# Patient Record
Sex: Female | Born: 1975 | Race: White | Hispanic: No | Marital: Married | Smoking: Former smoker
Health system: Southern US, Community
[De-identification: ages and names within clinical notes are randomized; demographics above are authoritative.]

## PROBLEM LIST (undated history)

## (undated) DIAGNOSIS — R7303 Prediabetes: Secondary | ICD-10-CM

## (undated) DIAGNOSIS — M419 Scoliosis, unspecified: Secondary | ICD-10-CM

## (undated) DIAGNOSIS — N939 Abnormal uterine and vaginal bleeding, unspecified: Secondary | ICD-10-CM

## (undated) DIAGNOSIS — I10 Essential (primary) hypertension: Secondary | ICD-10-CM

## (undated) DIAGNOSIS — Z9889 Other specified postprocedural states: Secondary | ICD-10-CM

## (undated) DIAGNOSIS — O3440 Maternal care for other abnormalities of cervix, unspecified trimester: Secondary | ICD-10-CM

## (undated) DIAGNOSIS — K219 Gastro-esophageal reflux disease without esophagitis: Secondary | ICD-10-CM

## (undated) DIAGNOSIS — K589 Irritable bowel syndrome without diarrhea: Secondary | ICD-10-CM

## (undated) HISTORY — DX: Maternal care for other abnormalities of cervix, unspecified trimester: Z98.890

## (undated) HISTORY — PX: CHOLECYSTECTOMY: SHX55

## (undated) HISTORY — DX: Maternal care for other abnormalities of cervix, unspecified trimester: O34.40

## (undated) HISTORY — PX: TUBAL LIGATION: SHX77

## (undated) HISTORY — PX: LEEP: SHX91

---

## 1997-11-30 ENCOUNTER — Other Ambulatory Visit: Admission: RE | Admit: 1997-11-30 | Discharge: 1997-11-30 | Payer: Self-pay | Admitting: Obstetrics and Gynecology

## 1997-12-16 ENCOUNTER — Inpatient Hospital Stay (HOSPITAL_COMMUNITY): Admission: AD | Admit: 1997-12-16 | Discharge: 1997-12-16 | Payer: Self-pay | Admitting: Obstetrics and Gynecology

## 1998-02-17 ENCOUNTER — Inpatient Hospital Stay (HOSPITAL_COMMUNITY): Admission: AD | Admit: 1998-02-17 | Discharge: 1998-02-17 | Payer: Self-pay | Admitting: Obstetrics and Gynecology

## 1998-03-22 ENCOUNTER — Ambulatory Visit (HOSPITAL_COMMUNITY): Admission: RE | Admit: 1998-03-22 | Discharge: 1998-03-22 | Payer: Self-pay | Admitting: Obstetrics & Gynecology

## 1998-04-03 ENCOUNTER — Ambulatory Visit (HOSPITAL_COMMUNITY): Admission: RE | Admit: 1998-04-03 | Discharge: 1998-04-03 | Payer: Self-pay | Admitting: Obstetrics & Gynecology

## 1998-06-18 ENCOUNTER — Inpatient Hospital Stay (HOSPITAL_COMMUNITY): Admission: AD | Admit: 1998-06-18 | Discharge: 1998-06-20 | Payer: Self-pay | Admitting: Obstetrics and Gynecology

## 1998-07-24 ENCOUNTER — Other Ambulatory Visit: Admission: RE | Admit: 1998-07-24 | Discharge: 1998-07-24 | Payer: Self-pay | Admitting: Obstetrics and Gynecology

## 1999-12-05 ENCOUNTER — Inpatient Hospital Stay (HOSPITAL_COMMUNITY): Admission: AD | Admit: 1999-12-05 | Discharge: 1999-12-05 | Payer: Self-pay | Admitting: Obstetrics & Gynecology

## 2002-06-28 ENCOUNTER — Emergency Department (HOSPITAL_COMMUNITY): Admission: EM | Admit: 2002-06-28 | Discharge: 2002-06-28 | Payer: Self-pay

## 2002-07-01 ENCOUNTER — Encounter: Admission: RE | Admit: 2002-07-01 | Discharge: 2002-07-01 | Payer: Self-pay | Admitting: Internal Medicine

## 2002-07-05 ENCOUNTER — Encounter: Admission: RE | Admit: 2002-07-05 | Discharge: 2002-07-05 | Payer: Self-pay | Admitting: Internal Medicine

## 2002-07-12 ENCOUNTER — Encounter: Admission: RE | Admit: 2002-07-12 | Discharge: 2002-07-12 | Payer: Self-pay | Admitting: Internal Medicine

## 2002-07-19 ENCOUNTER — Encounter: Admission: RE | Admit: 2002-07-19 | Discharge: 2002-07-19 | Payer: Self-pay | Admitting: Internal Medicine

## 2002-08-03 ENCOUNTER — Encounter: Admission: RE | Admit: 2002-08-03 | Discharge: 2002-08-03 | Payer: Self-pay | Admitting: Internal Medicine

## 2002-08-06 ENCOUNTER — Encounter: Admission: RE | Admit: 2002-08-06 | Discharge: 2002-08-06 | Payer: Self-pay | Admitting: Internal Medicine

## 2002-08-24 ENCOUNTER — Encounter: Admission: RE | Admit: 2002-08-24 | Discharge: 2002-08-24 | Payer: Self-pay | Admitting: Internal Medicine

## 2002-12-26 ENCOUNTER — Emergency Department (HOSPITAL_COMMUNITY): Admission: EM | Admit: 2002-12-26 | Discharge: 2002-12-26 | Payer: Self-pay | Admitting: Emergency Medicine

## 2004-06-01 ENCOUNTER — Inpatient Hospital Stay (HOSPITAL_COMMUNITY): Admission: AD | Admit: 2004-06-01 | Discharge: 2004-06-01 | Payer: Self-pay | Admitting: Family Medicine

## 2004-06-04 ENCOUNTER — Inpatient Hospital Stay (HOSPITAL_COMMUNITY): Admission: AD | Admit: 2004-06-04 | Discharge: 2004-06-04 | Payer: Self-pay | Admitting: Obstetrics and Gynecology

## 2005-01-10 ENCOUNTER — Ambulatory Visit (HOSPITAL_COMMUNITY): Admission: RE | Admit: 2005-01-10 | Discharge: 2005-01-10 | Payer: Self-pay | Admitting: Obstetrics & Gynecology

## 2005-04-09 ENCOUNTER — Inpatient Hospital Stay (HOSPITAL_COMMUNITY): Admission: AD | Admit: 2005-04-09 | Discharge: 2005-04-09 | Payer: Self-pay | Admitting: Obstetrics and Gynecology

## 2005-04-14 ENCOUNTER — Inpatient Hospital Stay (HOSPITAL_COMMUNITY): Admission: AD | Admit: 2005-04-14 | Discharge: 2005-04-16 | Payer: Self-pay | Admitting: Obstetrics and Gynecology

## 2005-10-20 ENCOUNTER — Emergency Department (HOSPITAL_COMMUNITY): Admission: EM | Admit: 2005-10-20 | Discharge: 2005-10-20 | Payer: Self-pay | Admitting: Emergency Medicine

## 2005-12-05 ENCOUNTER — Ambulatory Visit: Payer: Self-pay | Admitting: Family Medicine

## 2005-12-19 ENCOUNTER — Encounter (INDEPENDENT_AMBULATORY_CARE_PROVIDER_SITE_OTHER): Payer: Self-pay | Admitting: Specialist

## 2005-12-19 ENCOUNTER — Ambulatory Visit: Payer: Self-pay | Admitting: *Deleted

## 2005-12-19 ENCOUNTER — Other Ambulatory Visit: Admission: RE | Admit: 2005-12-19 | Discharge: 2005-12-19 | Payer: Self-pay | Admitting: Family Medicine

## 2006-01-09 ENCOUNTER — Ambulatory Visit: Payer: Self-pay | Admitting: Obstetrics & Gynecology

## 2006-09-02 ENCOUNTER — Inpatient Hospital Stay (HOSPITAL_COMMUNITY): Admission: AD | Admit: 2006-09-02 | Discharge: 2006-09-02 | Payer: Self-pay | Admitting: Obstetrics and Gynecology

## 2006-10-06 ENCOUNTER — Encounter: Admission: RE | Admit: 2006-10-06 | Discharge: 2006-10-06 | Payer: Self-pay | Admitting: Obstetrics & Gynecology

## 2006-11-15 ENCOUNTER — Inpatient Hospital Stay (HOSPITAL_COMMUNITY): Admission: AD | Admit: 2006-11-15 | Discharge: 2006-11-15 | Payer: Self-pay | Admitting: Obstetrics and Gynecology

## 2006-11-17 ENCOUNTER — Inpatient Hospital Stay (HOSPITAL_COMMUNITY): Admission: AD | Admit: 2006-11-17 | Discharge: 2006-11-20 | Payer: Self-pay | Admitting: Obstetrics & Gynecology

## 2008-09-10 ENCOUNTER — Emergency Department (HOSPITAL_BASED_OUTPATIENT_CLINIC_OR_DEPARTMENT_OTHER): Admission: EM | Admit: 2008-09-10 | Discharge: 2008-09-10 | Payer: Self-pay | Admitting: Emergency Medicine

## 2008-11-30 ENCOUNTER — Ambulatory Visit: Payer: Self-pay | Admitting: Obstetrics & Gynecology

## 2008-11-30 ENCOUNTER — Encounter: Payer: Self-pay | Admitting: Obstetrics & Gynecology

## 2010-02-24 ENCOUNTER — Ambulatory Visit: Payer: Self-pay | Admitting: Diagnostic Radiology

## 2010-02-24 ENCOUNTER — Emergency Department (HOSPITAL_BASED_OUTPATIENT_CLINIC_OR_DEPARTMENT_OTHER): Admission: EM | Admit: 2010-02-24 | Discharge: 2010-02-24 | Payer: Self-pay | Admitting: Emergency Medicine

## 2010-08-11 ENCOUNTER — Encounter: Payer: Self-pay | Admitting: Gastroenterology

## 2010-10-05 LAB — BASIC METABOLIC PANEL
BUN: 10 mg/dL (ref 6–23)
CO2: 24 mEq/L (ref 19–32)
Calcium: 8.9 mg/dL (ref 8.4–10.5)
Chloride: 104 mEq/L (ref 96–112)
Creatinine, Ser: 0.8 mg/dL (ref 0.4–1.2)
GFR calc Af Amer: >60 mL/min (ref >60)
GFR calc non Af Amer: >60 mL/min (ref >60)
Glucose, Bld: 96 mg/dL (ref 70–99)
Potassium: 4.1 mEq/L (ref 3.5–5.1)
Sodium: 140 mEq/L (ref 135–145)

## 2010-10-05 LAB — CBC
HCT: 40.7 % (ref 36.0–46.0)
Hemoglobin: 14.2 g/dL (ref 12.0–15.0)
MCH: 30.8 pg (ref 26.0–34.0)
MCHC: 35 g/dL (ref 30.0–36.0)
MCV: 88 fL (ref 78.0–100.0)
Platelets: 151 K/uL (ref 150–400)
RBC: 4.63 MIL/uL (ref 3.87–5.11)
RDW: 12.8 % (ref 11.5–15.5)
WBC: 7.2 K/uL (ref 4.0–10.5)

## 2010-10-05 LAB — POCT CARDIAC MARKERS
CKMB, poc: <1 ng/mL — ABNORMAL LOW (ref 1.0–8.0)
Myoglobin, poc: 46.5 ng/mL (ref 12–200)
Troponin i, poc: <0.05 ng/mL (ref 0.00–0.09)

## 2010-10-05 LAB — DIFFERENTIAL
Lymphocytes Relative: 19 % (ref 12–46)
Lymphs Abs: 1.4 10*3/uL (ref 0.7–4.0)
Neutro Abs: 5.2 10*3/uL (ref 1.7–7.7)
Neutrophils Relative %: 73 % (ref 43–77)

## 2010-12-04 NOTE — Group Therapy Note (Signed)
NAME:  Melissa Aguirre, Melissa Aguirre NO.:  [id number]   MEDICAL RECORD NO.:  [id number]          PATIENT TYPE:  WOC   LOCATION:  [facility]                   FACILITY:  [facility]   PHYSICIAN:  Scheryl Darter, MD       DATE OF BIRTH:  05/07/1976   DATE OF SERVICE:                                  CLINIC NOTE   CHIEF COMPLAINT:  Annual exam.   The patient is a 35 year old white female, gravida 4, para 3, abortus 1,  last menstrual period Nov 23, 2008.  She has had tubal ligation.  Her  cycles are about every 35-40 days, and periods are not heavy.  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  She has not had a  Pap smear since then.  No complaints of abnormal bleeding.  No discharge  or pain.   PAST MEDICAL HISTORY:  None.   PAST SURGICAL HISTORY:  1. Postpartum bilateral tubal ligation.  2. LEEP.  3. Cholecystectomy.   ALLERGIES:  SULFA.   MEDICATIONS:  None.   SOCIAL HISTORY:  The patient is a nonsmoker.  She denies alcohol or drug  use.  She denies any physical abuse.   REVIEW OF SYSTEMS:  Essentially negative today.   PHYSICAL EXAMINATION:  GENERAL:  The patient is in no acute distress.  VITAL SIGNS:  Weight is 202 pounds.  BP 135/96, pulse 75, height 5 feet  9 inches, temperature 97.9.  CHEST:  Clear.  HEART:  Regular rate and rhythm.  BREASTS:  Large without masses or tenderness.  ABDOMEN:  Soft, nontender, no mass.  EXTREMITIES:  Show no swelling.  She has a bruise on her left inner calf  from dropping a large object on her leg.  Says this is improving.  PELVIC EXAM:  External genitalia, vagina and cervix were normal with  well-healed cervix from her LEEP.  Pap was done.  Uterus normal size,  nontender, no mass.   IMPRESSION:  1. Normal gynecologic exam.  2. 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   PLAN:  The patient will be notified of her Pap result.  If normal, she  will return in 6 months for repeat Pap.      Scheryl Darter, MD     JA/MEDQ  D:  11/30/2008  T:  11/30/2008  Job:  [id number]

## 2010-12-07 NOTE — Op Note (Signed)
NAMEHAILY, CALEY              ACCOUNT NO.:  [id number]   MEDICAL RECORD NO.:  [id number]          PATIENT TYPE:  INP   LOCATION:  9121                          [facility]   PHYSICIAN:  Kendra H. Tenny Craw, MD     DATE OF BIRTH:  Oct 11, 1975   DATE OF PROCEDURE:  11/18/2006  DATE OF DISCHARGE:                               OPERATIVE REPORT   PREOPERATIVE DIAGNOSIS:  Desired permanent sterilization.   POSTOPERATIVE DIAGNOSIS:  Desired permanent sterilization.   PROCEDURE:  Postpartum tubal ligation with Filshie clips.   SURGEON:  Freddrick March. Tenny Craw, M.D.   ASSISTANT:  None.   ANESTHESIA:  Epidural.   ESTIMATED BLOOD LOSS:  Minimal.   SPECIMEN  none   COMPLICATIONS:  None.   PROCEDURE:  Ms. Tayloe is a 35 year old G5, P4-0-1-4 who presented to  labor and delivery on November 17, 2006 in spontaneous labor. She delivered  a vigorous female infant at 2:00 in the morning on April 29. She did  desire permanent sterilization and requested a postpartum tubal  ligation. At approximately 28 weeks during her pregnancy, she signed  postpartum tubal papers. The risks, benefits and alternatives of the  procedure were discussed with the patient, and we proceeded with  postpartum tubal ligation.   Following the appropriate informed consent, the patient was brought to  the operating room where epidural anesthesia was found to be adequate.  She was placed in a dorsal supine position and prepped and draped in the  normal sterile fashion. A scalpel was used to make a small semilunar  infraumbilical incision. The underlying soft tissue was dissected to the  fascia. The fascia was grasped with Hemostat and tented up x2, and the  fascia was entered with Mayo scissors. The abdominal peritoneum was then  identified, tented up and entered sharply with the Mayo scissors. The  intra-abdominal location was confirmed with direct visualization. The  uterine fundus was noted at the umbilicus. The patient  was airplaned to  the right. The patient's left tube was identified, picked up with  Babcock clamps and followed down into the fimbriated end. A Filshie clip  applicator was then used to apply a Filshie clip to the tube, and the  tube was returned atraumatically to the abdominal cavity. The patient  was then airplaned to the left, and again the right fallopian tube was  identified, tented up with Babcock clamps, followed out to the  fimbriated end of the tube, and Filshie clip was placed on the left  tube. The tube was then atraumatically returned to the abdominal cavity.  The fascia was then closed with #1 Vicryl in a running fashion, and the  skin was  closed with a 4-0 Vicryl in a subcuticular fashion, and 10 mL of 0.25%  Marcaine was infiltrated into the subcutaneous tissue following the  procedure. All sponge, lap and needle counts were correct x2. The  patient tolerated the procedure well and was brought to the recovery  room in stable condition following the procedure.      Freddrick March. Tenny Craw, MD  Electronically Signed  KHR/MEDQ  D:  11/18/2006  T:  11/18/2006  Job:  [id number]   cc:   Freddrick March. Tenny Craw, MD

## 2010-12-07 NOTE — Group Therapy Note (Signed)
NAME:  ALOHA, BARTOK NO.:  [id number]   MEDICAL RECORD NO.:  [id number]          PATIENT TYPE:  WOC   LOCATION:  [facility]                   FACILITY:  [facility]   PHYSICIAN:  Dorthula Perfect, MD     DATE OF BIRTH:  09/10/1975   DATE OF SERVICE:                                    CLINIC NOTE   HISTORY OF PRESENT ILLNESS:  This 35 year old female, multigravida, returns  for followup, having had a LEEP with Dr. Lauralee Evener on Dec 19, 2005.  She had  CIN 2 disease.  She had very slight bleeding the first week, but has not had  any since that time.  She does have some amount of symptomatic white  discharge at this time.  Pathology revealed high-grade squamous  intraepithelial lesions (CIN 2) with focal involvement of the ectocervical  margin.   PHYSICAL EXAMINATION:  PELVIC:  External genitalia and BUS glands normal.  Vaginal wall was epithelialized as was the cervix.  There was a watery,  white discharge without appreciable odor.  Uterus is midline, normal size  and shape.  Ovaries are normal.   LABORATORY DATA:  Wet prep was performed which revealed just a little bit of  bacterial vaginosis.   IMPRESSION:  1.  Cervical dysplasia, cervical intraepithelial neoplasia, grade 2, status      post loop electrosurgical excision procedure.  2.  Bacterial vaginosis.   DISPOSITION:  1.  Patient to follow up with a Pap smear in 4-6 months.  2.  Metronidazole 500 mg, 14 tablets, to take 1 b.i.d.           ______________________________  Dorthula Perfect, MD     ER/MEDQ  D:  01/09/2006  T:  01/09/2006  Job:  [id number]

## 2010-12-07 NOTE — Group Therapy Note (Signed)
NAME:  CYNDI, MONTEJANO NO.:  [id number]   MEDICAL RECORD NO.:  [id number]          PATIENT TYPE:  WOC   LOCATION:  [facility]                   FACILITY:  [facility]   PHYSICIAN:  Ellis Parents, MD    DATE OF BIRTH:  June 03, 1976   DATE OF SERVICE:                                    CLINIC NOTE   This 35 year old female is coming in for a LEEP procedure.  A colposcopic  biopsy on October 14, 2005 showed high grade squamous intraepithelial lesions,  CIN II with extension into the endocervical glands.  Colposcopic examination  today revealed acetyl white area at 11 o'clock.  The cervix is very  hypertrophic.  The T zone is extended from the os approximately 1 cm in all  directions.  The cervix is slightly friable in nature.  The cervix was  infiltrated with approximately 10 ml of lidocaine 2% with epinephrine  1:200,000.  Using a #22 LP needle after a delay period of approximately 5-7  minutes, the #20 x 12 LEEP wire was used to remove cervical tissue from the  anterior lip and from the posterior lip in separate pieces.  The ball  cautery was used to cauterize the operative site, and Monsel solution was  applied.  Inspection revealed complete hemostasis.   Patient was advised on any unusual bleeding patterns over the next week or  two.  She is to return in three weeks for followup.           ______________________________  Ellis Parents, MD     SA/MEDQ  D:  12/19/2005  T:  12/19/2005  Job:  [id number]

## 2010-12-07 NOTE — Discharge Summary (Signed)
NAME:  Melissa Aguirre, Melissa Aguirre               ACCOUNT NO.:  [id number]   MEDICAL RECORD NO.:  [id number]          PATIENT TYPE:  INP   LOCATION:  9103                          [facility]   PHYSICIAN:  Gerrit Friends. Aldona Bar, M.D.   DATE OF BIRTH:  1976-01-03   DATE OF ADMISSION:  04/14/2005  DATE OF DISCHARGE:  04/16/2005                                 DISCHARGE SUMMARY   DISCHARGE DIAGNOSES:  1.  Term pregnancy, delivered, 8-pound 13-ounce female infant, Apgars 9 and 9.  2.  Positive group B streptococcus antenatally.  3.  Blood type A negative - RhoGAM given.   PROCEDURE:  1.  Normal spontaneous delivery.  2.  Second-degree tear with repair.   SUMMARY:  This 35 year old gravida 4 para 2 was admitted at term with  ruptured membranes in early labor. At the time of admission she was 2 cm  dilated, 50% effaced, with the vertex at -3 position. She was begun on  intravenous group B strep prophylaxis and Pitocin augmentation was begun.  She requested and received an epidural, had subsequent good progression, and  subsequently had a normal spontaneous delivery with a second-degree tear, of  viable female infant with good Apgars. Her postpartum course was  uncomplicated. Her discharge hemoglobin was 10.3 with a white count 11,100  and a platelet count of 162,000. She did receive RhoGAM prior to discharge.  On the morning of April 16, 2005 she was ambulating, tolerating a  regular diet well, having normal bowel and bladder function, her bottle  feeding was going well (she was instructed accordingly), she was afebrile,  her vital signs were stable, and she was ready for discharge. Accordingly,  she was given all appropriate instructions and understood all instructions  well. Discharge medications include Feosol capsules - one daily, Motrin 600  mg every 6 hours as needed for pain, and Tylox one to two every 4-6 hours as  needed for more severe pain. She will return the office for follow-up in  approximately four weeks' time or as needed.   CONDITION ON DISCHARGE:  Improved.      Gerrit Friends. Aldona Bar, M.D.  Electronically Signed     RMW/MEDQ  D:  04/16/2005  T:  04/16/2005  Job:  [id number]

## 2013-08-17 ENCOUNTER — Encounter (HOSPITAL_BASED_OUTPATIENT_CLINIC_OR_DEPARTMENT_OTHER): Payer: Self-pay | Admitting: Emergency Medicine

## 2013-08-17 ENCOUNTER — Emergency Department (HOSPITAL_BASED_OUTPATIENT_CLINIC_OR_DEPARTMENT_OTHER)
Admission: EM | Admit: 2013-08-17 | Discharge: 2013-08-17 | Disposition: A | Discharge status: Home / Self Care | Payer: Medicaid Other | Attending: Emergency Medicine | Admitting: Emergency Medicine

## 2013-08-17 ENCOUNTER — Emergency Department (HOSPITAL_BASED_OUTPATIENT_CLINIC_OR_DEPARTMENT_OTHER): Payer: Medicaid Other

## 2013-08-17 DIAGNOSIS — Z79899 Other long term (current) drug therapy: Secondary | ICD-10-CM | POA: Insufficient documentation

## 2013-08-17 DIAGNOSIS — Y939 Activity, unspecified: Secondary | ICD-10-CM | POA: Insufficient documentation

## 2013-08-17 DIAGNOSIS — M4120 Other idiopathic scoliosis, site unspecified: Secondary | ICD-10-CM | POA: Insufficient documentation

## 2013-08-17 DIAGNOSIS — IMO0002 Reserved for concepts with insufficient information to code with codable children: Secondary | ICD-10-CM | POA: Insufficient documentation

## 2013-08-17 DIAGNOSIS — F17200 Nicotine dependence, unspecified, uncomplicated: Secondary | ICD-10-CM | POA: Insufficient documentation

## 2013-08-17 DIAGNOSIS — S9031XA Contusion of right foot, initial encounter: Secondary | ICD-10-CM

## 2013-08-17 DIAGNOSIS — K219 Gastro-esophageal reflux disease without esophagitis: Secondary | ICD-10-CM | POA: Insufficient documentation

## 2013-08-17 DIAGNOSIS — S9030XA Contusion of unspecified foot, initial encounter: Secondary | ICD-10-CM | POA: Insufficient documentation

## 2013-08-17 DIAGNOSIS — Y929 Unspecified place or not applicable: Secondary | ICD-10-CM | POA: Insufficient documentation

## 2013-08-17 DIAGNOSIS — I10 Essential (primary) hypertension: Secondary | ICD-10-CM | POA: Insufficient documentation

## 2013-08-17 HISTORY — DX: Gastro-esophageal reflux disease without esophagitis: K21.9

## 2013-08-17 HISTORY — DX: Scoliosis, unspecified: M41.9

## 2013-08-17 HISTORY — DX: Essential (primary) hypertension: I10

## 2013-08-17 MED ORDER — HYDROCODONE-ACETAMINOPHEN 5-325 MG PO TABS: 2.0000 | ORAL_TABLET | ORAL | Status: DC | PRN

## 2013-08-17 MED ORDER — INDOMETHACIN 25 MG PO CAPS: 25.0000 mg | ORAL_CAPSULE | Freq: Three times a day (TID) | ORAL | Status: DC | PRN

## 2013-08-17 NOTE — Discharge Instructions (Signed)
Foot Contusion °A foot contusion is a deep bruise to the foot. Contusions are the result of an injury that caused bleeding under the skin. The contusion may turn blue, purple, or yellow. Minor injuries will give you a painless contusion, but more severe contusions may stay painful and swollen for a few weeks. °CAUSES  °A foot contusion comes from a direct blow to that area, such as a heavy object falling on the foot. °SYMPTOMS  °· Swelling of the foot. °· Discoloration of the foot. °· Tenderness or soreness of the foot. °DIAGNOSIS  °You will have a physical exam and will be asked about your history. You may need an X-ray of your foot to look for a broken bone (fracture).  °TREATMENT  °An elastic wrap may be recommended to support your foot. Resting, elevating, and applying cold compresses to your foot are often the best treatments for a foot contusion. Over-the-counter medicines may also be recommended for pain control. °HOME CARE INSTRUCTIONS  °· Put ice on the injured area. °· Put ice in a plastic bag. °· Place a towel between your skin and the bag. °· Leave the ice on for 15-20 minutes, 03-04 times a day. °· Only take over-the-counter or prescription medicines for pain, discomfort, or fever as directed by your caregiver. °· If told, use an elastic wrap as directed. This can help reduce swelling. You may remove the wrap for sleeping, showering, and bathing. If your toes become numb, cold, or blue, take the wrap off and reapply it more loosely. °· Elevate your foot with pillows to reduce swelling. °· Try to avoid standing or walking while the foot is painful. Do not resume use until instructed by your caregiver. Then, begin use gradually. If pain develops, decrease use. Gradually increase activities that do not cause discomfort until you have normal use of your foot. °· See your caregiver as directed. It is very important to keep all follow-up appointments in order to avoid any lasting problems with your foot,  including long-term (chronic) pain. °SEEK IMMEDIATE MEDICAL CARE IF:  °· You have increased redness, swelling, or pain in your foot. °· Your swelling or pain is not relieved with medicines. °· You have loss of feeling in your foot or are unable to move your toes. °· Your foot turns cold or blue. °· You have pain when you move your toes. °· Your foot becomes warm to the touch. °· Your contusion does not improve in 2 days. °MAKE SURE YOU:  °· Understand these instructions. °· Will watch your condition. °· Will get help right away if you are not doing well or get worse. °Document Released: 04/29/2006 Document Revised: 01/07/2012 Document Reviewed: 06/11/2011 °Exit[employer]® Patient Information ©2014 [employer]. ° °

## 2013-08-17 NOTE — ED Provider Notes (Signed)
History/physical exam/procedure(s) were performed by non-physician practitioner and as supervising physician I was immediately available for consultation/collaboration. I have reviewed all notes and am in agreement with care and plan.   Syair Fricker S Navaya Wiatrek, MD 08/17/13 1520 

## 2013-08-17 NOTE — ED Provider Notes (Signed)
CSN: [id number]     Arrival date & time 08/17/13  1125 History   First MD Initiated Contact with Patient 08/17/13 1221     Chief Complaint  Patient presents with  . Foot Injury   (Consider location/radiation/quality/duration/timing/severity/associated sxs/prior Treatment) Patient is a 38 y.o. female presenting with lower extremity pain. The history is provided by the patient. No language interpreter was used.  Foot Pain This is a new problem. The current episode started today. The problem occurs constantly. The problem has been unchanged. Associated symptoms include joint swelling and myalgias. Nothing aggravates the symptoms. She has tried nothing for the symptoms. The treatment provided moderate relief.  Pt hit foot on dresser a week ago.   Pt complains of swelling and pain to foot and 1st toe.  Past Medical History  Diagnosis Date  . Scoliosis   . Hypertension   . GERD (gastroesophageal reflux disease)    Past Surgical History  Procedure Laterality Date  . Cholecystectomy    . Leep     No family history on file. History  Substance Use Topics  . Smoking status: Current Every Day Smoker  . Smokeless tobacco: Not on file  . Alcohol Use: Yes   OB History   Grav Para Term Preterm Abortions TAB SAB Ect Mult Living                 Review of Systems  Musculoskeletal: Positive for joint swelling and myalgias.  All other systems reviewed and are negative.    Allergies  Sulfa antibiotics  Home Medications   Current Outpatient Rx  Name  Route  Sig  Dispense  Refill  . amLODipine (NORVASC) 10 MG tablet   Oral   Take 10 mg by mouth daily.         . hydrochlorothiazide (HYDRODIURIL) 25 MG tablet   Oral   Take 25 mg by mouth daily.         Marland Kitchen losartan (COZAAR) 100 MG tablet   Oral   Take 100 mg by mouth daily.         . ranitidine (ZANTAC) 150 MG tablet   Oral   Take 150 mg by mouth 2 (two) times daily.         Marland Kitchen tiZANidine (ZANAFLEX) 4 MG capsule   Oral  Take 4 mg by mouth 3 (three) times daily.          BP 129/85  Pulse 95  Temp(Src) 98.3 F (36.8 C) (Oral)  Resp 16  Ht 5\' 8"  (1.727 m)  Wt 195 lb (88.451 kg)  BMI 29.66 kg/m2  SpO2 100%  LMP 08/16/2013 Physical Exam  Nursing note and vitals reviewed. Constitutional: She is oriented to person, place, and time. She appears well-developed and well-nourished.  Musculoskeletal: She exhibits tenderness.  Swollen right 1st toe,  Swollen mcp joint, bruised  Neurological: She is alert and oriented to person, place, and time. She has normal reflexes.  Skin: Skin is warm.  Psychiatric: She has a normal mood and affect.    ED Course  Procedures (including critical care time) Labs Review Labs Reviewed - No data to display Imaging Review No results found.  EKG Interpretation   None       MDM   1. Contusion of foot, right    Post op shoe, ace wrap,  Indocin and hydrocodone.   (I advised could also be gout as 1st MCp is area of pain)    Elson Areas, PA-C 08/17/13 1307  Lonia SkinnerLeslie K CastlefordSofia, PA-C 08/17/13 [phone]

## 2013-08-17 NOTE — ED Notes (Signed)
Hit right foot on dresser 1 week ago-c/o increased pain and swelling-no break in skin-pt ambulated to tx area

## 2013-12-01 ENCOUNTER — Emergency Department (HOSPITAL_BASED_OUTPATIENT_CLINIC_OR_DEPARTMENT_OTHER)
Admission: EM | Admit: 2013-12-01 | Discharge: 2013-12-01 | Disposition: A | Discharge status: Home / Self Care | Payer: Medicaid Other | Attending: Emergency Medicine | Admitting: Emergency Medicine

## 2013-12-01 ENCOUNTER — Encounter (HOSPITAL_BASED_OUTPATIENT_CLINIC_OR_DEPARTMENT_OTHER): Payer: Self-pay | Admitting: Emergency Medicine

## 2013-12-01 DIAGNOSIS — L259 Unspecified contact dermatitis, unspecified cause: Secondary | ICD-10-CM

## 2013-12-01 DIAGNOSIS — Z8739 Personal history of other diseases of the musculoskeletal system and connective tissue: Secondary | ICD-10-CM | POA: Insufficient documentation

## 2013-12-01 DIAGNOSIS — I10 Essential (primary) hypertension: Secondary | ICD-10-CM | POA: Insufficient documentation

## 2013-12-01 DIAGNOSIS — F17200 Nicotine dependence, unspecified, uncomplicated: Secondary | ICD-10-CM | POA: Insufficient documentation

## 2013-12-01 DIAGNOSIS — K219 Gastro-esophageal reflux disease without esophagitis: Secondary | ICD-10-CM | POA: Insufficient documentation

## 2013-12-01 DIAGNOSIS — Z79899 Other long term (current) drug therapy: Secondary | ICD-10-CM | POA: Insufficient documentation

## 2013-12-01 MED ORDER — PREDNISONE 10 MG PO TABS: 20.0000 mg | ORAL_TABLET | Freq: Two times a day (BID) | ORAL | Status: DC

## 2013-12-01 MED ORDER — HYDROXYZINE HCL 25 MG PO TABS: 25.0000 mg | ORAL_TABLET | Freq: Four times a day (QID) | ORAL | Status: DC

## 2013-12-01 NOTE — ED Notes (Signed)
MD at bedside. 

## 2013-12-01 NOTE — Discharge Instructions (Signed)
Prednisone as prescribed.  Hydroxyzine as prescribed as needed for itching.   Contact Dermatitis Contact dermatitis is a reaction to certain substances that touch the skin. Contact dermatitis can be either irritant contact dermatitis or allergic contact dermatitis. Irritant contact dermatitis does not require previous exposure to the substance for a reaction to occur.Allergic contact dermatitis only occurs if you have been exposed to the substance before. Upon a repeat exposure, your body reacts to the substance.  CAUSES  Many substances can cause contact dermatitis. Irritant dermatitis is most commonly caused by repeated exposure to mildly irritating substances, such as:  Makeup.  Soaps.  Detergents.  Bleaches.  Acids.  Metal salts, such as nickel. Allergic contact dermatitis is most commonly caused by exposure to:  Poisonous plants.  Chemicals (deodorants, shampoos).  Jewelry.  Latex.  Neomycin in triple antibiotic cream.  Preservatives in products, including clothing. SYMPTOMS  The area of skin that is exposed may develop:  Dryness or flaking.  Redness.  Cracks.  Itching.  Pain or a burning sensation.  Blisters. With allergic contact dermatitis, there may also be swelling in areas such as the eyelids, mouth, or genitals.  DIAGNOSIS  Your caregiver can usually tell what the problem is by doing a physical exam. In cases where the cause is uncertain and an allergic contact dermatitis is suspected, a patch skin test may be performed to help determine the cause of your dermatitis. TREATMENT Treatment includes protecting the skin from further contact with the irritating substance by avoiding that substance if possible. Barrier creams, powders, and gloves may be helpful. Your caregiver may also recommend:  Steroid creams or ointments applied 2 times daily. For best results, soak the rash area in cool water for 20 minutes. Then apply the medicine. Cover the area with  a plastic wrap. You can store the steroid cream in the refrigerator for a "chilly" effect on your rash. That may decrease itching. Oral steroid medicines may be needed in more severe cases.  Antibiotics or antibacterial ointments if a skin infection is present.  Antihistamine lotion or an antihistamine taken by mouth to ease itching.  Lubricants to keep moisture in your skin.  Burow's solution to reduce redness and soreness or to dry a weeping rash. Mix one packet or tablet of solution in 2 cups cool water. Dip a clean washcloth in the mixture, wring it out a bit, and put it on the affected area. Leave the cloth in place for 30 minutes. Do this as often as possible throughout the day.  Taking several cornstarch or baking soda baths daily if the area is too large to cover with a washcloth. Harsh chemicals, such as alkalis or acids, can cause skin damage that is like a burn. You should flush your skin for 15 to 20 minutes with cold water after such an exposure. You should also seek immediate medical care after exposure. Bandages (dressings), antibiotics, and pain medicine may be needed for severely irritated skin.  HOME CARE INSTRUCTIONS  Avoid the substance that caused your reaction.  Keep the area of skin that is affected away from hot water, soap, sunlight, chemicals, acidic substances, or anything else that would irritate your skin.  Do not scratch the rash. Scratching may cause the rash to become infected.  You may take cool baths to help stop the itching.  Only take over-the-counter or prescription medicines as directed by your caregiver.  See your caregiver for follow-up care as directed to make sure your skin is healing properly.  SEEK MEDICAL CARE IF:   Your condition is not better after 3 days of treatment.  You seem to be getting worse.  You see signs of infection such as swelling, tenderness, redness, soreness, or warmth in the affected area.  You have any problems related  to your medicines. Document Released: 07/05/2000 Document Revised: 09/30/2011 Document Reviewed: 12/11/2010 [facility] CenterExit[facility] Patient Information 2014 [place]ExitCare, [place].

## 2013-12-01 NOTE — ED Notes (Signed)
Generalized rash that started over 1 week ago.  Also reports left foot pain since Saturday.

## 2013-12-02 NOTE — ED Provider Notes (Signed)
CSN: [id number]     Arrival date & time 12/01/13  1001 History   First MD Initiated Contact with Patient 12/01/13 1045     Chief Complaint  Patient presents with  . Rash     (Consider location/radiation/quality/duration/timing/severity/associated sxs/prior Treatment) Patient is a 38 y.o. female presenting with rash. The history is provided by the patient.  Rash Location:  Full body Quality: scaling   Severity:  Moderate Onset quality:  Sudden Duration:  1 week Timing:  Constant Progression:  Worsening Chronicity:  New Relieved by:  Nothing Worsened by:  Nothing tried Ineffective treatments:  None tried   Past Medical History  Diagnosis Date  . Scoliosis   . Hypertension   . GERD (gastroesophageal reflux disease)    Past Surgical History  Procedure Laterality Date  . Cholecystectomy    . Leep     No family history on file. History  Substance Use Topics  . Smoking status: Current Every Day Smoker  . Smokeless tobacco: Not on file  . Alcohol Use: Yes     Comment: occasional   OB History   Grav Para Term Preterm Abortions TAB SAB Ect Mult Living                 Review of Systems  Skin: Positive for rash.  All other systems reviewed and are negative.     Allergies  Sulfa antibiotics  Home Medications   Prior to Admission medications   Medication Sig Start Date End Date Taking? Authorizing Provider  amLODipine (NORVASC) 10 MG tablet Take 10 mg by mouth daily.    Historical Provider, MD  hydrochlorothiazide (HYDRODIURIL) 25 MG tablet Take 25 mg by mouth daily.    Historical Provider, MD  HYDROcodone-acetaminophen (NORCO/VICODIN) 5-325 MG per tablet Take 2 tablets by mouth every 4 (four) hours as needed. 08/17/13   Elson AreasLeslie K Sofia, PA-C  hydrOXYzine (ATARAX/VISTARIL) 25 MG tablet Take 1 tablet (25 mg total) by mouth every 6 (six) hours. 12/01/13   Geoffery Lyonsouglas Jhoana Upham, MD  indomethacin (INDOCIN) 25 MG capsule Take 1 capsule (25 mg total) by mouth 3 (three) times daily  as needed. 08/17/13   Elson AreasLeslie K Sofia, PA-C  losartan (COZAAR) 100 MG tablet Take 100 mg by mouth daily.    Historical Provider, MD  predniSONE (DELTASONE) 10 MG tablet Take 2 tablets (20 mg total) by mouth 2 (two) times daily. 12/01/13   Geoffery Lyonsouglas Deep Bonawitz, MD  ranitidine (ZANTAC) 150 MG tablet Take 150 mg by mouth 2 (two) times daily.    Historical Provider, MD  tiZANidine (ZANAFLEX) 4 MG capsule Take 4 mg by mouth 3 (three) times daily.    Historical Provider, MD   BP 125/84  Pulse 93  Temp(Src) 98 F (36.7 C) (Oral)  Resp 24  Ht 5\' 8"  (1.727 m)  Wt 195 lb (88.451 kg)  BMI 29.66 kg/m2  LMP 11/13/2013 Physical Exam  Nursing note and vitals reviewed. Constitutional: She is oriented to person, place, and time. She appears well-developed and well-nourished. No distress.  HENT:  Head: Normocephalic and atraumatic.  Neck: Normal range of motion. Neck supple.  Neurological: She is alert and oriented to person, place, and time.  Skin: Skin is warm and dry. She is not diaphoretic.  There is a generalized, macular rash to the arms legs and torso.     ED Course  Procedures (including critical care time) Labs Review Labs Reviewed - No data to display  Imaging Review No results found.   EKG Interpretation  None      MDM   Final diagnoses:  Contact dermatitis    This appears to be a contact dermatitis. We'll treat with prednisone and hydroxyzine and when necessary followup.    Geoffery Lyonsouglas Denali Becvar, MD 12/02/13 1320

## 2014-01-11 ENCOUNTER — Other Ambulatory Visit: Payer: Self-pay

## 2014-01-11 DIAGNOSIS — Z1231 Encounter for screening mammogram for malignant neoplasm of breast: Secondary | ICD-10-CM

## 2014-01-18 ENCOUNTER — Encounter (INDEPENDENT_AMBULATORY_CARE_PROVIDER_SITE_OTHER): Payer: Self-pay

## 2014-01-18 ENCOUNTER — Ambulatory Visit
Admission: RE | Admit: 2014-01-18 | Discharge: 2014-01-18 | Disposition: A | Discharge status: Home / Self Care | Payer: Medicaid Other | Source: Ambulatory Visit

## 2014-01-18 DIAGNOSIS — Z1231 Encounter for screening mammogram for malignant neoplasm of breast: Secondary | ICD-10-CM

## 2014-01-27 ENCOUNTER — Other Ambulatory Visit: Payer: Self-pay | Admitting: Family Medicine

## 2014-01-27 DIAGNOSIS — R928 Other abnormal and inconclusive findings on diagnostic imaging of breast: Secondary | ICD-10-CM

## 2014-02-02 ENCOUNTER — Ambulatory Visit
Admission: RE | Admit: 2014-02-02 | Discharge: 2014-02-02 | Disposition: A | Discharge status: Home / Self Care | Payer: Medicaid Other | Source: Ambulatory Visit | Attending: Family Medicine | Admitting: Family Medicine

## 2014-02-02 DIAGNOSIS — R928 Other abnormal and inconclusive findings on diagnostic imaging of breast: Secondary | ICD-10-CM

## 2014-06-22 ENCOUNTER — Emergency Department (HOSPITAL_BASED_OUTPATIENT_CLINIC_OR_DEPARTMENT_OTHER): Payer: Medicaid Other

## 2014-06-22 ENCOUNTER — Emergency Department (HOSPITAL_BASED_OUTPATIENT_CLINIC_OR_DEPARTMENT_OTHER)
Admission: EM | Admit: 2014-06-22 | Discharge: 2014-06-22 | Disposition: A | Discharge status: Home / Self Care | Payer: Medicaid Other | Attending: Emergency Medicine | Admitting: Emergency Medicine

## 2014-06-22 ENCOUNTER — Encounter (HOSPITAL_BASED_OUTPATIENT_CLINIC_OR_DEPARTMENT_OTHER): Payer: Self-pay

## 2014-06-22 DIAGNOSIS — Z791 Long term (current) use of non-steroidal anti-inflammatories (NSAID): Secondary | ICD-10-CM | POA: Insufficient documentation

## 2014-06-22 DIAGNOSIS — R05 Cough: Secondary | ICD-10-CM

## 2014-06-22 DIAGNOSIS — R0789 Other chest pain: Secondary | ICD-10-CM | POA: Diagnosis not present

## 2014-06-22 DIAGNOSIS — J029 Acute pharyngitis, unspecified: Secondary | ICD-10-CM | POA: Insufficient documentation

## 2014-06-22 DIAGNOSIS — K219 Gastro-esophageal reflux disease without esophagitis: Secondary | ICD-10-CM | POA: Diagnosis not present

## 2014-06-22 DIAGNOSIS — B9789 Other viral agents as the cause of diseases classified elsewhere: Secondary | ICD-10-CM

## 2014-06-22 DIAGNOSIS — I10 Essential (primary) hypertension: Secondary | ICD-10-CM | POA: Insufficient documentation

## 2014-06-22 DIAGNOSIS — R059 Cough, unspecified: Secondary | ICD-10-CM

## 2014-06-22 DIAGNOSIS — Z8739 Personal history of other diseases of the musculoskeletal system and connective tissue: Secondary | ICD-10-CM | POA: Diagnosis not present

## 2014-06-22 DIAGNOSIS — Z79899 Other long term (current) drug therapy: Secondary | ICD-10-CM | POA: Insufficient documentation

## 2014-06-22 DIAGNOSIS — J069 Acute upper respiratory infection, unspecified: Secondary | ICD-10-CM | POA: Diagnosis not present

## 2014-06-22 DIAGNOSIS — Z7952 Long term (current) use of systemic steroids: Secondary | ICD-10-CM | POA: Diagnosis not present

## 2014-06-22 LAB — RAPID STREP SCREEN (MED CTR MEBANE ONLY): Streptococcus, Group A Screen (Direct): NEGATIVE

## 2014-06-22 MED ORDER — BENZONATATE 100 MG PO CAPS: 200.0000 mg | ORAL_CAPSULE | Freq: Two times a day (BID) | ORAL | Status: DC | PRN

## 2014-06-22 MED ORDER — ALBUTEROL SULFATE HFA 108 (90 BASE) MCG/ACT IN AERS
2.0000 | INHALATION_SPRAY | RESPIRATORY_TRACT | Status: DC | PRN
  Administered 2014-06-22: 2 via RESPIRATORY_TRACT
  Filled 2014-06-22: qty 6.7

## 2014-06-22 NOTE — ED Notes (Signed)
PA at bedside.

## 2014-06-22 NOTE — Discharge Instructions (Signed)
1. Medications: albuterol, mucinex (over the counter), tessalon, usual home medications 2. Treatment: rest, drink plenty of fluids, take tylenol or ibuprofen for fever control 3. Follow Up: Please followup with your primary doctor in 3 days for discussion of your diagnoses and further evaluation after today's visit; if you do not have a primary care doctor use the resource guide provided to find one; Return to the ER for high fevers, difficulty breathing or other concerning symptoms   Pharyngitis Pharyngitis is redness, pain, and swelling (inflammation) of your pharynx.  CAUSES  Pharyngitis is usually caused by infection. Most of the time, these infections are from viruses (viral) and are part of a cold. However, sometimes pharyngitis is caused by bacteria (bacterial). Pharyngitis can also be caused by allergies. Viral pharyngitis may be spread from person to person by coughing, sneezing, and personal items or utensils (cups, forks, spoons, toothbrushes). Bacterial pharyngitis may be spread from person to person by more intimate contact, such as kissing.  SIGNS AND SYMPTOMS  Symptoms of pharyngitis include:   Sore throat.   Tiredness (fatigue).   Low-grade fever.   Headache.  Joint pain and muscle aches.  Skin rashes.  Swollen lymph nodes.  Plaque-like film on throat or tonsils (often seen with bacterial pharyngitis). DIAGNOSIS  Your health care provider will ask you questions about your illness and your symptoms. Your medical history, along with a physical exam, is often all that is needed to diagnose pharyngitis. Sometimes, a rapid strep test is done. Other lab tests may also be done, depending on the suspected cause.  TREATMENT  Viral pharyngitis will usually get better in 3-4 days without the use of medicine. Bacterial pharyngitis is treated with medicines that kill germs (antibiotics).  HOME CARE INSTRUCTIONS   Drink enough water and fluids to keep your urine clear or pale  yellow.   Only take over-the-counter or prescription medicines as directed by your health care provider:   If you are prescribed antibiotics, make sure you finish them even if you start to feel better.   Do not take aspirin.   Get lots of rest.   Gargle with 8 oz of salt water ( tsp of salt per 1 qt of water) as often as every 1-2 hours to soothe your throat.   Throat lozenges (if you are not at risk for choking) or sprays may be used to soothe your throat. SEEK MEDICAL CARE IF:   You have large, tender lumps in your neck.  You have a rash.  You cough up green, yellow-brown, or bloody spit. SEEK IMMEDIATE MEDICAL CARE IF:   Your neck becomes stiff.  You drool or are unable to swallow liquids.  You vomit or are unable to keep medicines or liquids down.  You have severe pain that does not go away with the use of recommended medicines.  You have trouble breathing (not caused by a stuffy nose). MAKE SURE YOU:   Understand these instructions.  Will watch your condition.  Will get help right away if you are not doing well or get worse. Document Released: 07/08/2005 Document Revised: 04/28/2013 Document Reviewed: 03/15/2013 [facility] Patient Information 2015 [place], [place]. This information is not intended to replace advice given to you by your health care provider. Make sure you discuss any questions you have with your health care provider.

## 2014-06-22 NOTE — ED Provider Notes (Signed)
CSN: [id number]     Arrival date & time 06/22/14  1100 History   First MD Initiated Contact with Patient 06/22/14 1200     Chief Complaint  Patient presents with  . URI     (Consider location/radiation/quality/duration/timing/severity/associated sxs/prior Treatment) Patient is a 38 y.o. female presenting with URI. The history is provided by the patient and medical records. No language interpreter was used.  URI Presenting symptoms: congestion, cough, rhinorrhea and sore throat   Presenting symptoms: no ear pain, no fatigue and no fever   Associated symptoms: no arthralgias, no headaches, no myalgias and no wheezing      Azalee CourseJulie M Kalmbach is a 38 y.o. female  with a hx of HTN, GERD, scoliosispresents to the Emergency Department complaining of gradual, persistent, progressively worsening URI symptoms onset 4 days ago, but worsening last night with severe sore throat. Patient also complains of "muscle cramps" in her anterior neck last night but this was not associated with difficulty breathing, stridor, difficulty swallowing or any other concerning symptoms. Patient reports she was able to immediately go back to sleep. Associated symptoms include rhinorrhea, postnasal drip, cough, chest tightness.  Patient is a current everyday smoker. She has not attempted any over-the-counter treatments for her URI.  She reports that she has used an inhaler in the past but does not currently have one.  Pt denies fever, chills, headache, posterior neck pain, neck stiffness, chest pain, shortest breath, abdominal pain, nausea, vomiting, diarrhea, weakness, dizziness, syncope.     Past Medical History  Diagnosis Date  . Scoliosis   . Hypertension   . GERD (gastroesophageal reflux disease)    Past Surgical History  Procedure Laterality Date  . Cholecystectomy    . Leep    . Tubal ligation     No family history on file. History  Substance Use Topics  . Smoking status: Current Every Day Smoker  .  Smokeless tobacco: Not on file  . Alcohol Use: Yes     Comment: occasional   OB History    No data available     Review of Systems  Constitutional: Negative for fever, chills, appetite change and fatigue.  HENT: Positive for congestion, postnasal drip, rhinorrhea, sinus pressure and sore throat. Negative for ear discharge, ear pain and mouth sores.   Eyes: Negative for visual disturbance.  Respiratory: Positive for cough and chest tightness. Negative for shortness of breath, wheezing and stridor.   Cardiovascular: Negative for chest pain, palpitations and leg swelling.  Gastrointestinal: Negative for nausea, vomiting, abdominal pain and diarrhea.  Genitourinary: Negative for dysuria, urgency, frequency and hematuria.  Musculoskeletal: Negative for myalgias, back pain, arthralgias and neck stiffness.  Skin: Negative for rash.  Neurological: Negative for syncope, light-headedness, numbness and headaches.  Hematological: Negative for adenopathy.  Psychiatric/Behavioral: The patient is not nervous/anxious.   All other systems reviewed and are negative.     Allergies  Shellfish allergy and Sulfa antibiotics  Home Medications   Prior to Admission medications   Medication Sig Start Date End Date Taking? Authorizing Provider  Cyclobenzaprine HCl (FLEXERIL PO) Take by mouth.   Yes Historical Provider, MD  amLODipine (NORVASC) 10 MG tablet Take 10 mg by mouth daily.    Historical Provider, MD  benzonatate (TESSALON) 100 MG capsule Take 2 capsules (200 mg total) by mouth 2 (two) times daily as needed for cough. 06/22/14   Annalena Piatt, PA-C  HYDROcodone-acetaminophen (NORCO/VICODIN) 5-325 MG per tablet Take 2 tablets by mouth every 4 (four) hours as  needed. 08/17/13   Elson AreasLeslie K Sofia, PA-C  hydrOXYzine (ATARAX/VISTARIL) 25 MG tablet Take 1 tablet (25 mg total) by mouth every 6 (six) hours. 12/01/13   Geoffery Lyonsouglas Delo, MD  indomethacin (INDOCIN) 25 MG capsule Take 1 capsule (25 mg total) by  mouth 3 (three) times daily as needed. 08/17/13   Elson AreasLeslie K Sofia, PA-C  losartan (COZAAR) 100 MG tablet Take 100 mg by mouth daily.    Historical Provider, MD  predniSONE (DELTASONE) 10 MG tablet Take 2 tablets (20 mg total) by mouth 2 (two) times daily. 12/01/13   Geoffery Lyonsouglas Delo, MD  ranitidine (ZANTAC) 150 MG tablet Take 150 mg by mouth 2 (two) times daily.    Historical Provider, MD   BP 140/90 mmHg  Pulse 95  Temp(Src) 98.4 F (36.9 C) (Oral)  Resp 18  Ht 5\' 8"  (1.727 m)  Wt 210 lb (95.255 kg)  BMI 31.94 kg/m2  SpO2 100%  LMP 05/29/2014 Physical Exam  Constitutional: She is oriented to person, place, and time. She appears well-developed and well-nourished. No distress.  HENT:  Head: Normocephalic and atraumatic.  Right Ear: Tympanic membrane, external ear and ear canal normal.  Left Ear: Tympanic membrane, external ear and ear canal normal.  Nose: Mucosal edema and rhinorrhea present. No epistaxis. Right sinus exhibits no maxillary sinus tenderness and no frontal sinus tenderness. Left sinus exhibits no maxillary sinus tenderness and no frontal sinus tenderness.  Mouth/Throat: Uvula is midline, oropharynx is clear and moist and mucous membranes are normal. Mucous membranes are not pale and not cyanotic. No oropharyngeal exudate, posterior oropharyngeal edema, posterior oropharyngeal erythema or tonsillar abscesses.  Eyes: Conjunctivae are normal. Pupils are equal, round, and reactive to light.  Neck: Normal range of motion, full passive range of motion without pain and phonation normal. No tracheal tenderness, no spinous process tenderness and no muscular tenderness present. No rigidity. No tracheal deviation, no erythema and normal range of motion present. No thyroid mass and no thyromegaly present.  Full ROM of the neck without difficulty or reported pain No midline or paraspinal tenderness Reported tenderness of the right superficial cervical lymph nodes without palpable  lymphadenopathy.   No thyromegaly, goiter or palpable mass No TTP of the sternocleidomastoid   Cardiovascular: Normal rate, normal heart sounds and intact distal pulses.   No murmur heard. Pulmonary/Chest: Effort normal. No accessory muscle usage or stridor. No tachypnea. No respiratory distress. She has decreased breath sounds (slightly). She has no wheezes. She has no rhonchi. She has no rales.  Coarse and equal breath sounds without focal wheezes, rhonchi, rales  Abdominal: Soft. Bowel sounds are normal. There is no tenderness.  Musculoskeletal: Normal range of motion.  Lymphadenopathy:    She has no cervical adenopathy.  Neurological: She is alert and oriented to person, place, and time.  Skin: Skin is warm and dry. No rash noted. She is not diaphoretic.  Psychiatric: She has a normal mood and affect.  Nursing note and vitals reviewed.   ED Course  Procedures (including critical care time) Labs Review Labs Reviewed  RAPID STREP SCREEN  CULTURE, GROUP A STREP    Imaging Review Dg Chest 2 View  06/22/2014   CLINICAL DATA:  Sore throat and mid chest tightness for 3 days. Smoker. Initial encounter.  EXAM: CHEST  2 VIEW  COMPARISON:  02/24/2010 radiographs.  FINDINGS: The heart size and mediastinal contours are normal. The lungs are clear. There is no pleural effusion or pneumothorax. No acute osseous findings are identified. A  mild thoracolumbar scoliosis appears unchanged.  IMPRESSION: Stable chest.  No active cardiopulmonary process.   Electronically Signed   By: Roxy Horseman M.D.   On: 06/22/2014 13:14     EKG Interpretation None      MDM   Final diagnoses:  Cough  Chest tightness  Viral URI with cough  Sore throat   [name] presents with sore throat, chest tightness and URI symptoms.   Pt CXR negative for acute infiltrate. Rapid strep negative.  Patients symptoms are consistent with URI, likely viral etiology.    Presents with mild cervical tenderness without  lymphadenopathy, afebrile without tonsillar exudate.  Strep test negative; diagnosis of viral pharyngitis. No abx indicated.   Pt does not appear dehydrated, but did discuss importance of water rehydration. Presentation non concerning for PTA or infxn spread to soft tissue. No trismus or uvula deviation. Specific return precautions discussed. Pt able to drink water in ED without difficulty with intact air way. Recommended PCP follow up. Pt will be discharged with symptomatic treatment.    I have personally reviewed patient's vitals, nursing note and any pertinent labs or imaging.  I performed an focused physical exam; undressed when appropriate .    It has been determined that no acute conditions requiring further emergency intervention are present at this time. The patient/guardian have been advised of the diagnosis and plan. I reviewed any labs and imaging including any potential incidental findings. We have discussed signs and symptoms that warrant return to the ED and they are listed in the discharge instructions.    Vital signs are stable at discharge.   BP 140/90 mmHg  Pulse 95  Temp(Src) 98.4 F (36.9 C) (Oral)  Resp 18  Ht 5\' 8"  (1.727 m)  Wt 210 lb (95.255 kg)  BMI 31.94 kg/m2  SpO2 100%  LMP 05/29/2014        Dierdre Forth, PA-C 06/22/14 1339  Warnell Forester, MD 06/22/14 1645

## 2014-06-22 NOTE — ED Notes (Signed)
C/o sore throat, "muscle cramps" in her throat, chest congestion

## 2014-06-24 LAB — CULTURE, GROUP A STREP

## 2015-05-11 ENCOUNTER — Encounter (HOSPITAL_BASED_OUTPATIENT_CLINIC_OR_DEPARTMENT_OTHER): Payer: Self-pay | Admitting: *Deleted

## 2015-05-11 ENCOUNTER — Emergency Department (HOSPITAL_BASED_OUTPATIENT_CLINIC_OR_DEPARTMENT_OTHER)
Admission: EM | Admit: 2015-05-11 | Discharge: 2015-05-11 | Disposition: A | Discharge status: Home / Self Care | Payer: Medicaid Other | Attending: Emergency Medicine | Admitting: Emergency Medicine

## 2015-05-11 DIAGNOSIS — K219 Gastro-esophageal reflux disease without esophagitis: Secondary | ICD-10-CM | POA: Insufficient documentation

## 2015-05-11 DIAGNOSIS — M419 Scoliosis, unspecified: Secondary | ICD-10-CM | POA: Insufficient documentation

## 2015-05-11 DIAGNOSIS — Z7952 Long term (current) use of systemic steroids: Secondary | ICD-10-CM | POA: Insufficient documentation

## 2015-05-11 DIAGNOSIS — Z79899 Other long term (current) drug therapy: Secondary | ICD-10-CM | POA: Insufficient documentation

## 2015-05-11 DIAGNOSIS — Z720 Tobacco use: Secondary | ICD-10-CM | POA: Insufficient documentation

## 2015-05-11 DIAGNOSIS — I10 Essential (primary) hypertension: Secondary | ICD-10-CM | POA: Insufficient documentation

## 2015-05-11 DIAGNOSIS — K644 Residual hemorrhoidal skin tags: Secondary | ICD-10-CM | POA: Insufficient documentation

## 2015-05-11 HISTORY — DX: Irritable bowel syndrome, unspecified: K58.9

## 2015-05-11 MED ORDER — FLUCONAZOLE 150 MG PO TABS: 150.0000 mg | ORAL_TABLET | Freq: Once | ORAL | Status: AC

## 2015-05-11 NOTE — ED Provider Notes (Signed)
CSN: [id number]     Arrival date & time 05/11/15  1300 History   First MD Initiated Contact with Patient 05/11/15 1310     Chief Complaint  Patient presents with  . Rectal Pain     (Consider location/radiation/quality/duration/timing/severity/associated sxs/prior Treatment) Patient is a 39 y.o. female presenting with general illness.  Illness Location:  Anus Quality:  Swelling, dull ache Severity:  Moderate Onset quality:  Gradual Duration:  4 weeks Timing:  Constant Progression:  Worsening Chronicity:  Recurrent Context:  Ho prior hemorrhoids and IBS, recently constipated which is now resolved Relieved by:  Nothing Worsened by:  Nothing Associated symptoms: no cough, no diarrhea, no fever, no loss of consciousness, no nausea, no shortness of breath and no vomiting     Past Medical History  Diagnosis Date  . Scoliosis   . Hypertension   . GERD (gastroesophageal reflux disease)   . IBS (irritable bowel syndrome)    Past Surgical History  Procedure Laterality Date  . Cholecystectomy    . Leep    . Tubal ligation     No family history on file. Social History  Substance Use Topics  . Smoking status: Current Every Day Smoker -- 0.50 packs/day    Types: Cigarettes  . Smokeless tobacco: None  . Alcohol Use: Yes     Comment: occasional   OB History    No data available     Review of Systems  Constitutional: Negative for fever.  Respiratory: Negative for cough and shortness of breath.   Gastrointestinal: Negative for nausea, vomiting and diarrhea.  Neurological: Negative for loss of consciousness.  All other systems reviewed and are negative.     Allergies  Shellfish allergy and Sulfa antibiotics  Home Medications   Prior to Admission medications   Medication Sig Start Date End Date Taking? Authorizing Provider  amLODipine (NORVASC) 10 MG tablet Take 10 mg by mouth daily.    Historical Provider, MD  benzonatate (TESSALON) 100 MG capsule Take 2 capsules  (200 mg total) by mouth 2 (two) times daily as needed for cough. 06/22/14   Hannah Muthersbaugh, PA-C  Cyclobenzaprine HCl (FLEXERIL PO) Take by mouth.    Historical Provider, MD  fluconazole (DIFLUCAN) 150 MG tablet Take 1 tablet (150 mg total) by mouth once. 05/11/15 05/18/15  Mirian Mo, MD  HYDROcodone-acetaminophen (NORCO/VICODIN) 5-325 MG per tablet Take 2 tablets by mouth every 4 (four) hours as needed. 08/17/13   Elson Areas, PA-C  hydrOXYzine (ATARAX/VISTARIL) 25 MG tablet Take 1 tablet (25 mg total) by mouth every 6 (six) hours. 12/01/13   Geoffery Lyons, MD  indomethacin (INDOCIN) 25 MG capsule Take 1 capsule (25 mg total) by mouth 3 (three) times daily as needed. 08/17/13   Elson Areas, PA-C  losartan (COZAAR) 100 MG tablet Take 100 mg by mouth daily.    Historical Provider, MD  predniSONE (DELTASONE) 10 MG tablet Take 2 tablets (20 mg total) by mouth 2 (two) times daily. 12/01/13   Geoffery Lyons, MD  ranitidine (ZANTAC) 150 MG tablet Take 150 mg by mouth 2 (two) times daily.    Historical Provider, MD   BP 136/96 mmHg  Pulse 92  Temp(Src) 98.4 F (36.9 C) (Oral)  Resp 18  Ht 5' 8.5" (1.74 m)  Wt 200 lb (90.719 kg)  BMI 29.96 kg/m2  SpO2 100%  LMP 04/22/2015 Physical Exam  Constitutional: She is oriented to person, place, and time. She appears well-developed and well-nourished.  HENT:  Head: Normocephalic and  atraumatic.  Right Ear: External ear normal.  Left Ear: External ear normal.  Eyes: Conjunctivae and EOM are normal. Pupils are equal, round, and reactive to light.  Neck: Normal range of motion. Neck supple.  Cardiovascular: Normal rate, regular rhythm, normal heart sounds and intact distal pulses.   Pulmonary/Chest: Effort normal and breath sounds normal.  Abdominal: Soft. Bowel sounds are normal. There is no tenderness.  Genitourinary: Rectal exam shows external hemorrhoid. Rectal exam shows no fissure and anal tone normal.  Musculoskeletal: Normal range of  motion.  Neurological: She is alert and oriented to person, place, and time.  Skin: Skin is warm and dry.  Vitals reviewed.   ED Course  Procedures (including critical care time) Labs Review Labs Reviewed - No data to display  Imaging Review No results found. I have personally reviewed and evaluated these images and lab results as part of my medical decision-making.   EKG Interpretation None      MDM   Final diagnoses:  External hemorrhoid    39 y.o. female with pertinent PMH of HTN, IBS presents with recurrent hemorrhoid.  Exam as above with likely external hemorrhoid, however one is large and indistinct, so strict return precautions given for possible developing abscess.  DC home to fu with surgery.    I have reviewed all laboratory and imaging studies if ordered as above  1. External hemorrhoid         Mirian MoMatthew Arly Salminen, MD 05/11/15 1326

## 2015-05-11 NOTE — Discharge Instructions (Signed)

## 2015-05-11 NOTE — ED Notes (Signed)
Rectal pain x 4 weeks. States she thinks she has hemorrhoids.

## 2017-07-11 ENCOUNTER — Other Ambulatory Visit: Payer: Self-pay | Admitting: Family Medicine

## 2017-07-11 DIAGNOSIS — Z1231 Encounter for screening mammogram for malignant neoplasm of breast: Secondary | ICD-10-CM

## 2017-08-05 ENCOUNTER — Other Ambulatory Visit: Payer: Self-pay

## 2017-08-05 ENCOUNTER — Emergency Department (HOSPITAL_BASED_OUTPATIENT_CLINIC_OR_DEPARTMENT_OTHER): Payer: No Typology Code available for payment source

## 2017-08-05 ENCOUNTER — Encounter (HOSPITAL_BASED_OUTPATIENT_CLINIC_OR_DEPARTMENT_OTHER): Payer: Self-pay | Admitting: Emergency Medicine

## 2017-08-05 ENCOUNTER — Emergency Department (HOSPITAL_BASED_OUTPATIENT_CLINIC_OR_DEPARTMENT_OTHER)
Admission: EM | Admit: 2017-08-05 | Discharge: 2017-08-05 | Disposition: A | Discharge status: Home / Self Care | Payer: No Typology Code available for payment source | Attending: Emergency Medicine | Admitting: Emergency Medicine

## 2017-08-05 DIAGNOSIS — Y999 Unspecified external cause status: Secondary | ICD-10-CM | POA: Diagnosis not present

## 2017-08-05 DIAGNOSIS — Y939 Activity, unspecified: Secondary | ICD-10-CM | POA: Diagnosis not present

## 2017-08-05 DIAGNOSIS — S4991XA Unspecified injury of right shoulder and upper arm, initial encounter: Secondary | ICD-10-CM | POA: Diagnosis present

## 2017-08-05 DIAGNOSIS — Y92410 Unspecified street and highway as the place of occurrence of the external cause: Secondary | ICD-10-CM | POA: Diagnosis not present

## 2017-08-05 DIAGNOSIS — S46911A Strain of unspecified muscle, fascia and tendon at shoulder and upper arm level, right arm, initial encounter: Secondary | ICD-10-CM | POA: Insufficient documentation

## 2017-08-05 MED ORDER — TRAMADOL HCL 50 MG PO TABS: 50.0000 mg | ORAL_TABLET | Freq: Every evening | ORAL | 0 refills | Status: DC | PRN

## 2017-08-05 MED ORDER — METHOCARBAMOL 500 MG PO TABS: 500.0000 mg | ORAL_TABLET | Freq: Three times a day (TID) | ORAL | 0 refills | Status: DC | PRN
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
## 2017-08-05 NOTE — Discharge Instructions (Signed)
Ibuprofen 400 mg twice per day with food as needed. Apply ice to sore swollen areas as needed. Robaxin, muscle relaxant. Ultram at night for the next few nights as needed for pain.
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
## 2017-08-15 ENCOUNTER — Ambulatory Visit
Admission: RE | Admit: 2017-08-15 | Discharge: 2017-08-15 | Disposition: A | Discharge status: Home / Self Care | Payer: No Typology Code available for payment source | Source: Ambulatory Visit | Attending: Family Medicine | Admitting: Family Medicine

## 2017-08-15 DIAGNOSIS — Z1231 Encounter for screening mammogram for malignant neoplasm of breast: Secondary | ICD-10-CM

## 2019-04-19 ENCOUNTER — Other Ambulatory Visit: Payer: Self-pay | Admitting: Family Medicine

## 2019-04-19 DIAGNOSIS — Z1231 Encounter for screening mammogram for malignant neoplasm of breast: Secondary | ICD-10-CM

## 2019-05-19 ENCOUNTER — Encounter: Payer: No Typology Code available for payment source | Admitting: Obstetrics & Gynecology

## 2019-06-07 ENCOUNTER — Ambulatory Visit
Admission: RE | Admit: 2019-06-07 | Discharge: 2019-06-07 | Disposition: A | Discharge status: Home / Self Care | Payer: Medicaid Other | Source: Ambulatory Visit | Attending: Family Medicine | Admitting: Family Medicine

## 2019-06-07 ENCOUNTER — Other Ambulatory Visit: Payer: Self-pay

## 2019-06-07 DIAGNOSIS — Z1231 Encounter for screening mammogram for malignant neoplasm of breast: Secondary | ICD-10-CM

## 2019-06-14 ENCOUNTER — Ambulatory Visit (INDEPENDENT_AMBULATORY_CARE_PROVIDER_SITE_OTHER): Payer: Medicaid Other | Admitting: Obstetrics & Gynecology

## 2019-06-14 ENCOUNTER — Other Ambulatory Visit (HOSPITAL_COMMUNITY)
Admission: RE | Admit: 2019-06-14 | Discharge: 2019-06-14 | Disposition: A | Discharge status: Home / Self Care | Payer: Medicaid Other | Source: Ambulatory Visit | Attending: Obstetrics & Gynecology | Admitting: Obstetrics & Gynecology

## 2019-06-14 ENCOUNTER — Other Ambulatory Visit: Payer: Self-pay

## 2019-06-14 ENCOUNTER — Encounter: Payer: Self-pay | Admitting: Obstetrics & Gynecology

## 2019-06-14 VITALS — BP 133/83 | HR 87 | Ht 68.5 in | Wt 222.0 lb

## 2019-06-14 DIAGNOSIS — Z3202 Encounter for pregnancy test, result negative: Secondary | ICD-10-CM

## 2019-06-14 DIAGNOSIS — N939 Abnormal uterine and vaginal bleeding, unspecified: Secondary | ICD-10-CM | POA: Insufficient documentation

## 2019-06-14 DIAGNOSIS — Z01411 Encounter for gynecological examination (general) (routine) with abnormal findings: Secondary | ICD-10-CM

## 2019-06-14 DIAGNOSIS — N930 Postcoital and contact bleeding: Secondary | ICD-10-CM

## 2019-06-14 DIAGNOSIS — N870 Mild cervical dysplasia: Secondary | ICD-10-CM | POA: Diagnosis not present

## 2019-06-14 DIAGNOSIS — Z9889 Other specified postprocedural states: Secondary | ICD-10-CM

## 2019-06-14 LAB — POCT URINE PREGNANCY: Preg Test, Ur: NEGATIVE

## 2019-06-14 MED ORDER — MEGESTROL ACETATE 40 MG PO TABS: 40.0000 mg | ORAL_TABLET | Freq: Two times a day (BID) | ORAL | 5 refills | Status: DC

## 2019-06-14 NOTE — Progress Notes (Signed)
Subjective:     Melissa Aguirre is a 43 y.o. female here for eval of AUB and post coital bleeding. Pt reports that she had a LEEP many years ago in 2007. She denies h/o abnormal PAPs since that time. Her last PAP was > 2 years prev. She reports that she has heavy bleeding at irreg intervals. He denies weight loss or constitutional sx.     Gynecologic History Patient's last menstrual period was 05/31/2019. Contraception: tubal ligation Last Pap: 2 years prev. Results were: normal (no results on chart) Last mammogram: 06/07/2019. Results were: normal  Obstetric History OB History  Gravida Para Term Preterm AB Living  5 4 4   1 1   SAB TAB Ectopic Multiple Live Births  1       1    # Outcome Date GA Lbr Len/2nd Weight Sex Delivery Anes PTL Lv  5 Term 2008 [redacted]w[redacted]d   F Vag-Spont EPI N   4 Term 2006 [redacted]w[redacted]d   M Vag-Spont EPI N   3 SAB 05/2004          2 Term 1999 [redacted]w[redacted]d   F Vag-Spont EPI N   1 Term 1996 [redacted]w[redacted]d   M Vag-Spont EPI N LIV   The following portions of the patient's history were reviewed and updated as appropriate: allergies, current medications, past family history, past medical history, past social history, past surgical history and problem list.  Review of Systems Pertinent items are noted in HPI.    Objective:  BP 133/83   Pulse 87   Ht 5' 8.5" (1.74 m)   Wt 222 lb 0.6 oz (100.7 kg)   LMP 05/31/2019   BMI 33.27 kg/m    CONSTITUTIONAL: Well-developed, well-nourished female in no acute distress.  HENT:  Normocephalic, atraumatic EYES: Conjunctivae and EOM are normal. No scleral icterus.  NECK: Normal range of motion SKIN: Skin is warm and dry. No rash noted. Not diaphoretic.No pallor. NEUROLGIC: Alert and oriented to person, place, and time. Normal coordination.  Abd: soft, NT, ND; obese GU: EGBUS: no lesions Vagina: no blood in vault Cervix: there is a lesion at the os. It is erythematous and friable. The cervix is firm. This lesion was biopsied x 3.  Uterus:  enlarged; ~10-12 weeks sized; mobile. The exam was limited by pts body habitus.  Adnexa: no masses; non tender   The indications for endometrial biopsy were reviewed.   Risks of the biopsy including cramping, bleeding, infection, uterine perforation, inadequate specimen and need for additional procedures  were discussed. The patient states she understands and agrees to undergo procedure today. Consent was signed. Time out was performed. Urine HCG was negative. A sterile speculum was placed in the patient's vagina and the cervix was prepped with Betadine. A single-toothed tenaculum was placed on the anterior lip of the cervix to stabilize it. The 3 mm pipelle was introduced into the endometrial cavity without difficulty to a depth of 9cm, and a moderate amount of tissue was obtained and sent to pathology. At this point cervical biopsies x 3 were obtained of the lesion at the os. The instruments were removed from the patient's vagina. Minimal bleeding from the cervix was noted. The patient tolerated the procedure well.    Assessment:   AUB and postcoital bleeding. There is a lesion at the cervical os which is concerning given pts h/o cervical dysplasia.   Her uterus is enlarged and I suspect there are fibroids which may contribute to her AUB.  Plan:  F/u surg path for cervix and endo bx TVUS to eval endometrium and uterus for fibroids Megace 40mg  bid  Routine post-procedure instructions were given to the patient. The patient will follow up in 2 weeks  to review the results and for further management.   F/u sooner prn  Jeyren Danowski L. Harraway-Smith, M.D., Cherlynn June

## 2019-06-16 LAB — CYTOLOGY - PAP
Comment: NEGATIVE
Diagnosis: NEGATIVE
High risk HPV: NEGATIVE

## 2019-06-16 LAB — SURGICAL PATHOLOGY

## 2019-06-21 ENCOUNTER — Ambulatory Visit (HOSPITAL_BASED_OUTPATIENT_CLINIC_OR_DEPARTMENT_OTHER)
Admission: RE | Admit: 2019-06-21 | Discharge: 2019-06-21 | Disposition: A | Discharge status: Home / Self Care | Payer: Medicaid Other | Source: Ambulatory Visit | Attending: Obstetrics & Gynecology | Admitting: Obstetrics & Gynecology

## 2019-06-21 ENCOUNTER — Other Ambulatory Visit: Payer: Self-pay

## 2019-06-21 DIAGNOSIS — Z9889 Other specified postprocedural states: Secondary | ICD-10-CM | POA: Diagnosis present

## 2019-06-21 DIAGNOSIS — N930 Postcoital and contact bleeding: Secondary | ICD-10-CM | POA: Diagnosis not present

## 2019-07-05 ENCOUNTER — Ambulatory Visit (INDEPENDENT_AMBULATORY_CARE_PROVIDER_SITE_OTHER): Payer: Medicaid Other | Admitting: Obstetrics & Gynecology

## 2019-07-05 ENCOUNTER — Other Ambulatory Visit: Payer: Self-pay

## 2019-07-05 ENCOUNTER — Encounter: Payer: Self-pay | Admitting: Obstetrics & Gynecology

## 2019-07-05 VITALS — BP 139/82 | HR 99 | Ht 68.5 in | Wt 223.0 lb

## 2019-07-05 DIAGNOSIS — N939 Abnormal uterine and vaginal bleeding, unspecified: Secondary | ICD-10-CM | POA: Diagnosis not present

## 2019-07-05 NOTE — Progress Notes (Signed)
History:  43 y.o. [id number] here today for eval of AUB and determination of management. Pt reports that she has been bleeding since Nov 5th. She reports that she took the Megace but, developed a gastritis. She has a h/o GI issues and is not sure if this was related to the meds.  She did not restart the Megace.  Seh deos reports that after starting the Megace, her bleeding became lighter.   The following portions of the patient's history were reviewed and updated as appropriate: allergies, current medications, past family history, past medical history, past social history, past surgical history and problem list.  Review of Systems:  Pertinent items are noted in HPI.    Objective:  Physical Exam Blood pressure 139/82, pulse 99, height 5' 8.5" (1.74 m), weight 223 lb (101.2 kg), last menstrual period 06/26/2019.  CONSTITUTIONAL: Well-developed, well-nourished female in no acute distress.  HENT:  Normocephalic, atraumatic EYES: Conjunctivae and EOM are normal. No scleral icterus.  NECK: Normal range of motion SKIN: Skin is warm and dry. No rash noted. Not diaphoretic.No pallor. NEUROLGIC: Alert and oriented to person, place, and time. Normal coordination.  Pelvic: deferred  Labs and Imaging [place] [facility] Transvaginal  Result Date: 06/21/2019 CLINICAL DATA:  Post coital bleeding, history of LEEP EXAM: ULTRASOUND PELVIS TRANSVAGINAL TECHNIQUE: Transvaginal ultrasound examination of the pelvis was performed including evaluation of the uterus, ovaries, adnexal regions, and pelvic cul-de-sac. COMPARISON:  None FINDINGS: Uterus Measurements: 9.2 x 6.7 x 7.8 cm = volume: 251 mL. Anteverted. Multiple nabothian cysts, largest a complex cyst 1.3 cm diameter. No additional uterine mass. Endometrium Thickness: 17 mm.  No endometrial fluid or focal abnormality Right ovary Measurements: 3.4 x 2.5 x 3.2 cm = volume: 14 mL. Normal morphology without mass Left ovary Not visualized on either transabdominal or endovaginal  imaging, likely obscured by bowel Other findings:  No free pelvic fluid.  No adnexal masses. IMPRESSION: Nonvisualization of LEFT ovary. Large complex nabothian cyst 1.3 cm diameter. Endometrial complex 17 mm thick. If bleeding remains unresponsive to hormonal or medical therapy, focal lesion work-up with sonohysterogram should be considered. Endometrial biopsy should also be considered in pre-menopausal patients at high risk for endometrial carcinoma. (Ref: Radiological Reasoning: Algorithmic Workup of Abnormal Vaginal Bleeding with Endovaginal Sonography and Sonohysterography. AJR 2008; 500:[id number]) Electronically Signed   By: Ulyses Southward M.D.   On: 06/21/2019 15:27   MM 3D SCREEN BREAST BILATERAL  Result Date: 06/08/2019 CLINICAL DATA:  Screening. EXAM: DIGITAL SCREENING BILATERAL MAMMOGRAM WITH TOMO AND CAD COMPARISON:  Previous exam(s). ACR Breast Density Category b: There are scattered areas of fibroglandular density. FINDINGS: There are no findings suspicious for malignancy. Images were processed with CAD. IMPRESSION: No mammographic evidence of malignancy. A result letter of this screening mammogram will be mailed directly to the patient. RECOMMENDATION: Screening mammogram in one year. (Code:SM-B-01Y) BI-RADS CATEGORY  1: Negative. Electronically Signed   By: Hulan Saas M.D.   On: 06/08/2019 08:16   06/14/2019 FINAL MICROSCOPIC DIAGNOSIS:   A. CERVIX, BIOPSY:  - Focal koilocytic atypia consistent with CIN-1.   B. ENDOMETRIUM, BIOPSY:  - Proliferative endometrium  - Benign endometrial polyp.  - No hyperplasia or carcinoma.  Assessment & Plan:  AUB- pt unable to tolerate Megace due to GI side effects. The biopsy suggests that a polyp may be present. I reviewed patients treatment options including continuing/restarting the medications, hysterectomy, hysteroscopy with endometrial ablation.   Patient desires surgical management with hysteroscopy with endometrial ablation and possible  polypectomy.  The risks of surgery were discussed in detail with the patient including but not limited to: bleeding which may require transfusion or reoperation; infection which may require prolonged hospitalization or re-hospitalization and antibiotic therapy; injury to bowel, bladder, ureters and major vessels or other surrounding organs; need for additional procedures including laparotomy; thromboembolic phenomenon, incisional problems and other postoperative or anesthesia complications.  Patient was told that the likelihood that her condition and symptoms will be treated effectively with this surgical management was very high; the postoperative expectations were also discussed in detail. The patient also understands the alternative treatment options which were discussed in full. All questions were answered.  She was told that she will be contacted by our surgical scheduler regarding the time and date of her surgery; routine preoperative instructions of having nothing to eat or drink after midnight on the day prior to surgery and also coming to the hospital 1 1/2 hours prior to her time of surgery were also emphasized.  She was told she may be called for a preoperative appointment about a week prior to surgery and will be given further preoperative instructions at that visit. Printed patient education handouts about the procedure were given to the patient to review at home.  Total face-to-face time with patient was 15 min.  Greater than 50% was spent in counseling and coordination of care with the patient.   Gwyn Mehring L. Harraway-Smith, M.D., Cherlynn June

## 2019-07-05 NOTE — Patient Instructions (Signed)
Endometrial Ablation Endometrial ablation is a procedure that destroys the thin inner layer of the lining of the uterus (endometrium). This procedure may be done:  To stop heavy periods.  To stop bleeding that is causing anemia.  To control irregular bleeding.  To treat bleeding caused by small tumors (fibroids) in the endometrium. This procedure is often an alternative to major surgery, such as removal of the uterus and cervix (hysterectomy). As a result of this procedure:  You may not be able to have children. However, if you are premenopausal (you have not gone through menopause): ? You may still have a small chance of getting pregnant. ? You will need to use a reliable method of birth control after the procedure to prevent pregnancy.  You may stop having a menstrual period, or you may have only a small amount of bleeding during your period. Menstruation may return several years after the procedure. Tell a health care provider about:  Any allergies you have.  All medicines you are taking, including vitamins, herbs, eye drops, creams, and over-the-counter medicines.  Any problems you or family members have had with the use of anesthetic medicines.  Any blood disorders you have.  Any surgeries you have had.  Any medical conditions you have. What are the risks? Generally, this is a safe procedure. However, problems may occur, including:  A hole (perforation) in the uterus or bowel.  Infection of the uterus, bladder, or vagina.  Bleeding.  Damage to other structures or organs.  An air bubble in the lung (air embolus).  Problems with pregnancy after the procedure.  Failure of the procedure.  Decreased ability to diagnose cancer in the endometrium. What happens before the procedure?  You will have tests of your endometrium to make sure there are no pre-cancerous cells or cancer cells present.  You may have an ultrasound of the uterus.  You may be given medicines to  thin the endometrium.  Ask your health care provider about: ? Changing or stopping your regular medicines. This is especially important if you take diabetes medicines or blood thinners. ? Taking medicines such as aspirin and ibuprofen. These medicines can thin your blood. Do not take these medicines before your procedure if your doctor tells you not to.  Plan to have someone take you home from the hospital or clinic. What happens during the procedure?   You will lie on an exam table with your feet and legs supported as in a pelvic exam.  To lower your risk of infection: ? Your health care team will wash or sanitize their hands and put on germ-free (sterile) gloves. ? Your genital area will be washed with soap.  An IV tube will be inserted into one of your veins.  You will be given a medicine to help you relax (sedative).  A surgical instrument with a light and camera (resectoscope) will be inserted into your vagina and moved into your uterus. This allows your surgeon to see inside your uterus.  Endometrial tissue will be removed using one of the following methods: ? Radiofrequency. This method uses a radiofrequency-alternating electric current to remove the endometrium. ? Cryotherapy. This method uses extreme cold to freeze the endometrium. ? Heated-free liquid. This method uses a heated saltwater (saline) solution to remove the endometrium. ? Microwave. This method uses high-energy microwaves to heat up the endometrium and remove it. ? Thermal balloon. This method involves inserting a catheter with a balloon tip into the uterus. The balloon tip is filled with   heated fluid to remove the endometrium. The procedure may vary among health care providers and hospitals. What happens after the procedure?  Your blood pressure, heart rate, breathing rate, and blood oxygen level will be monitored until the medicines you were given have worn off.  As tissue healing occurs, you may notice  vaginal bleeding for 4-6 weeks after the procedure. You may also experience: ? Cramps. ? Thin, watery vaginal discharge that is light pink or brown in color. ? A need to urinate more frequently than usual. ? Nausea.  Do not drive for 24 hours if you were given a sedative.  Do not have sex or insert anything into your vagina until your health care provider approves. Summary  Endometrial ablation is done to treat the many causes of heavy menstrual bleeding.  The procedure may be done only after medications have been tried to control the bleeding.  Plan to have someone take you home from the hospital or clinic. This information is not intended to replace advice given to you by your health care provider. Make sure you discuss any questions you have with your health care provider. Document Released: 05/17/2004 Document Revised: 12/23/2017 Document Reviewed: 07/25/2016 Elsevier Patient Education  [place]. Endometrial Ablation, Care After This sheet gives you information about how to care for yourself after your procedure. Your health care provider may also give you more specific instructions. If you have problems or questions, contact your health care provider. What can I expect after the procedure? After the procedure, it is common to have:  A need to urinate more frequently than usual for the first 24 hours.  Cramps similar to menstrual cramps. These may last for 1-2 days.  Thin, watery vaginal discharge that is light pink or brown in color. This may last a few weeks. Discharge will be heavy for the first few days after your procedure. You may need to wear a sanitary pad.  Nausea.  Vaginal bleeding for 4-6 weeks after the procedure, as tissue healing occurs. Follow these instructions at home: Activity  Do not drive for 24 hours if you were given amedicine to help you relax (sedative) during your procedure.  Do not have sex or put anything into your vagina until your  health care provider approves.  Do not lift anything that is heavier than 10 lb (4.5 kg), or the limit that you are told, until your health care provider says that it is safe.  Return to your normal activities as told by your health care provider. Ask your health care provider what activities are safe for you. General instructions   Take over-the-counter and prescription medicines only as told by your health care provider.  Do not take baths, swim, or use a hot tub until your health care provider approves. You will be able to take showers.  Check your vaginal area every day for signs of infection. Check for: ? Redness, swelling, or pain. ? More discharge or blood, instead of less. ? Bad-smelling discharge.  Keep all follow-up visits as told by your health care provider. This is important.  Drink enough fluid to keep your urine pale yellow. Contact a health care provider if you have:  Vaginal redness, swelling, or pain.  Vaginal discharge or bleeding that gets worse instead of getting better.  Bad-smelling vaginal discharge.  A fever or chills.  Trouble urinating. Get help right away if you have:  Heavy vaginal bleeding.  Severe cramps. Summary  After endometrial ablation, it is normal to  have thin, watery vaginal discharge that is light pink or brown in color. This may last a few weeks and may be heavier right after the procedure.  Vaginal bleeding is also normal after the procedure and should get better with time.  Check your vaginal area every day for signs of infection, such as bad-smelling discharge.  Keep all follow-up visits as told by your health care provider. This is important. This information is not intended to replace advice given to you by your health care provider. Make sure you discuss any questions you have with your health care provider. Document Released: 05/20/2017 Document Revised: 10/29/2018 Document Reviewed: 05/20/2017 Elsevier Patient Education   2020 [employer].

## 2019-07-14 ENCOUNTER — Other Ambulatory Visit: Payer: Self-pay

## 2019-07-14 ENCOUNTER — Encounter (HOSPITAL_BASED_OUTPATIENT_CLINIC_OR_DEPARTMENT_OTHER): Payer: Self-pay | Admitting: Obstetrics & Gynecology

## 2019-07-14 DIAGNOSIS — N939 Abnormal uterine and vaginal bleeding, unspecified: Secondary | ICD-10-CM

## 2019-07-14 NOTE — Progress Notes (Signed)
Attempted to contact patient to schedule pre-procedure COVID appointment, no answer, let message to call back

## 2019-07-14 NOTE — Progress Notes (Addendum)
Spoke w/ via phone for pre-op interview---[facility] needs dos----      I stat 8, ekg, urine poct , cbc, type and screen       Lab results------ COVID test ------07-23-18 Arrive at -------1100 am 07-26-18 NPO after ------midnight food, clear liquids from midnight until 700 am then npo Medications to take morning of surgery -----amodipine, famotidine., omeprazole Diabetic medication -----n/a Patient Special Instructions ----- Pre-Op special Istructions ----- Patient verbalized understanding of instructions that were given at this phone interview. Patient denies shortness of breath, chest pain, fever, cough a this phone interview.

## 2019-07-24 ENCOUNTER — Other Ambulatory Visit (HOSPITAL_COMMUNITY)
Admission: RE | Admit: 2019-07-24 | Discharge: 2019-07-24 | Disposition: A | Discharge status: Home / Self Care | Payer: Medicaid Other | Source: Ambulatory Visit | Attending: Obstetrics & Gynecology | Admitting: Obstetrics & Gynecology

## 2019-07-24 DIAGNOSIS — Z01812 Encounter for preprocedural laboratory examination: Secondary | ICD-10-CM | POA: Diagnosis present

## 2019-07-24 DIAGNOSIS — Z20822 Contact with and (suspected) exposure to covid-19: Secondary | ICD-10-CM | POA: Insufficient documentation

## 2019-07-24 LAB — SARS CORONAVIRUS 2 (TAT 6-24 HRS): SARS Coronavirus 2: NEGATIVE

## 2019-07-26 ENCOUNTER — Other Ambulatory Visit (HOSPITAL_COMMUNITY): Payer: Medicaid Other

## 2019-07-27 ENCOUNTER — Other Ambulatory Visit: Payer: Self-pay

## 2019-07-27 ENCOUNTER — Encounter (HOSPITAL_BASED_OUTPATIENT_CLINIC_OR_DEPARTMENT_OTHER)
Admission: RE | Disposition: A | Discharge status: Home / Self Care | Payer: Self-pay | Source: Home / Self Care | Attending: Obstetrics & Gynecology

## 2019-07-27 ENCOUNTER — Ambulatory Visit (HOSPITAL_BASED_OUTPATIENT_CLINIC_OR_DEPARTMENT_OTHER)
Admission: RE | Admit: 2019-07-27 | Discharge: 2019-07-27 | Disposition: A | Discharge status: Home / Self Care | Payer: Medicaid Other | Attending: Obstetrics & Gynecology | Admitting: Obstetrics & Gynecology

## 2019-07-27 ENCOUNTER — Ambulatory Visit (HOSPITAL_BASED_OUTPATIENT_CLINIC_OR_DEPARTMENT_OTHER): Payer: Medicaid Other | Admitting: Certified Registered"

## 2019-07-27 ENCOUNTER — Encounter (HOSPITAL_BASED_OUTPATIENT_CLINIC_OR_DEPARTMENT_OTHER): Payer: Self-pay | Admitting: Obstetrics & Gynecology

## 2019-07-27 DIAGNOSIS — Z7982 Long term (current) use of aspirin: Secondary | ICD-10-CM | POA: Diagnosis not present

## 2019-07-27 DIAGNOSIS — Z79899 Other long term (current) drug therapy: Secondary | ICD-10-CM | POA: Insufficient documentation

## 2019-07-27 DIAGNOSIS — N939 Abnormal uterine and vaginal bleeding, unspecified: Secondary | ICD-10-CM | POA: Diagnosis present

## 2019-07-27 DIAGNOSIS — K219 Gastro-esophageal reflux disease without esophagitis: Secondary | ICD-10-CM | POA: Insufficient documentation

## 2019-07-27 DIAGNOSIS — E669 Obesity, unspecified: Secondary | ICD-10-CM | POA: Insufficient documentation

## 2019-07-27 DIAGNOSIS — N840 Polyp of corpus uteri: Secondary | ICD-10-CM | POA: Diagnosis not present

## 2019-07-27 DIAGNOSIS — R7303 Prediabetes: Secondary | ICD-10-CM | POA: Insufficient documentation

## 2019-07-27 DIAGNOSIS — Z87891 Personal history of nicotine dependence: Secondary | ICD-10-CM | POA: Diagnosis not present

## 2019-07-27 DIAGNOSIS — I10 Essential (primary) hypertension: Secondary | ICD-10-CM | POA: Insufficient documentation

## 2019-07-27 DIAGNOSIS — Z6834 Body mass index (BMI) 34.0-34.9, adult: Secondary | ICD-10-CM | POA: Diagnosis not present

## 2019-07-27 HISTORY — DX: Prediabetes: R73.03

## 2019-07-27 HISTORY — PX: DILITATION & CURRETTAGE/HYSTROSCOPY WITH NOVASURE ABLATION: SHX5568

## 2019-07-27 HISTORY — DX: Abnormal uterine and vaginal bleeding, unspecified: N93.9

## 2019-07-27 LAB — CBC
HCT: 33.3 % — ABNORMAL LOW (ref 36.0–46.0)
Hemoglobin: 10.4 g/dL — ABNORMAL LOW (ref 12.0–15.0)
MCH: 23.5 pg — ABNORMAL LOW (ref 26.0–34.0)
MCHC: 31.2 g/dL (ref 30.0–36.0)
MCV: 75.2 fL — ABNORMAL LOW (ref 80.0–100.0)
Platelets: 243 10*3/uL (ref 150–400)
RBC: 4.43 MIL/uL (ref 3.87–5.11)
RDW: 16 % — ABNORMAL HIGH (ref 11.5–15.5)
WBC: 7.5 10*3/uL (ref 4.0–10.5)
nRBC: 0 % (ref 0.0–0.2)

## 2019-07-27 LAB — POCT I-STAT, CHEM 8
BUN: 8 mg/dL (ref 6–20)
Calcium, Ion: 1.19 mmol/L (ref 1.15–1.40)
Chloride: 101 mmol/L (ref 98–111)
Creatinine, Ser: 0.8 mg/dL (ref 0.44–1.00)
Glucose, Bld: 138 mg/dL — ABNORMAL HIGH (ref 70–99)
HCT: 32 % — ABNORMAL LOW (ref 36.0–46.0)
Hemoglobin: 10.9 g/dL — ABNORMAL LOW (ref 12.0–15.0)
Potassium: 3.6 mmol/L (ref 3.5–5.1)
Sodium: 135 mmol/L (ref 135–145)
TCO2: 25 mmol/L (ref 22–32)

## 2019-07-27 LAB — TYPE AND SCREEN
ABO/RH(D): A NEG
Antibody Screen: NEGATIVE

## 2019-07-27 LAB — ABO/RH: ABO/RH(D): A NEG

## 2019-07-27 SURGERY — DILATATION & CURETTAGE/HYSTEROSCOPY WITH NOVASURE ABLATION
Anesthesia: General

## 2019-07-27 MED ORDER — BUPIVACAINE HCL (PF) 0.5 % IJ SOLN
INTRAMUSCULAR | Status: DC | PRN
  Administered 2019-07-27: 20 mL

## 2019-07-27 MED ORDER — ESMOLOL HCL 100 MG/10ML IV SOLN
INTRAVENOUS | Status: DC | PRN
  Administered 2019-07-27: 20 ug via INTRAVENOUS

## 2019-07-27 MED ORDER — ONDANSETRON HCL 4 MG/2ML IJ SOLN
INTRAMUSCULAR | Status: AC
  Filled 2019-07-27: qty 2

## 2019-07-27 MED ORDER — PHENYLEPHRINE 40 MCG/ML (10ML) SYRINGE FOR IV PUSH (FOR BLOOD PRESSURE SUPPORT)
PREFILLED_SYRINGE | INTRAVENOUS | Status: AC
  Filled 2019-07-27: qty 10

## 2019-07-27 MED ORDER — LACTATED RINGERS IV SOLN
INTRAVENOUS | Status: DC
  Filled 2019-07-27: qty 1000

## 2019-07-27 MED ORDER — GABAPENTIN 300 MG PO CAPS
ORAL_CAPSULE | ORAL | Status: AC
  Filled 2019-07-27: qty 1

## 2019-07-27 MED ORDER — FENTANYL CITRATE (PF) 100 MCG/2ML IJ SOLN
INTRAMUSCULAR | Status: AC
  Filled 2019-07-27: qty 2

## 2019-07-27 MED ORDER — FENTANYL CITRATE (PF) 100 MCG/2ML IJ SOLN
25.0000 ug | INTRAMUSCULAR | Status: DC | PRN
  Administered 2019-07-27: 50 ug via INTRAVENOUS
  Administered 2019-07-27: 25 ug via INTRAVENOUS
  Filled 2019-07-27: qty 1

## 2019-07-27 MED ORDER — DEXAMETHASONE SODIUM PHOSPHATE 10 MG/ML IJ SOLN
INTRAMUSCULAR | Status: DC | PRN
  Administered 2019-07-27: 10 mg via INTRAVENOUS

## 2019-07-27 MED ORDER — OXYCODONE HCL 5 MG PO TABS
ORAL_TABLET | ORAL | Status: AC
  Filled 2019-07-27: qty 1

## 2019-07-27 MED ORDER — EPHEDRINE 5 MG/ML INJ
INTRAVENOUS | Status: AC
  Filled 2019-07-27: qty 10

## 2019-07-27 MED ORDER — PROPOFOL 10 MG/ML IV BOLUS
INTRAVENOUS | Status: AC
  Filled 2019-07-27: qty 20

## 2019-07-27 MED ORDER — LIDOCAINE 2% (20 MG/ML) 5 ML SYRINGE
INTRAMUSCULAR | Status: AC
  Filled 2019-07-27: qty 5

## 2019-07-27 MED ORDER — IBUPROFEN 800 MG PO TABS: 800.0000 mg | ORAL_TABLET | Freq: Three times a day (TID) | ORAL | 0 refills | Status: DC | PRN

## 2019-07-27 MED ORDER — ONDANSETRON HCL 4 MG/2ML IJ SOLN
INTRAMUSCULAR | Status: DC | PRN
  Administered 2019-07-27: 4 mg via INTRAVENOUS

## 2019-07-27 MED ORDER — ESMOLOL HCL 100 MG/10ML IV SOLN
INTRAVENOUS | Status: AC
  Filled 2019-07-27: qty 10

## 2019-07-27 MED ORDER — ACETAMINOPHEN 500 MG PO TABS
ORAL_TABLET | ORAL | Status: AC
  Filled 2019-07-27: qty 2

## 2019-07-27 MED ORDER — GABAPENTIN 300 MG PO CAPS
300.0000 mg | ORAL_CAPSULE | ORAL | Status: AC
  Administered 2019-07-27: 300 mg via ORAL
  Filled 2019-07-27: qty 1

## 2019-07-27 MED ORDER — DEXAMETHASONE SODIUM PHOSPHATE 10 MG/ML IJ SOLN
INTRAMUSCULAR | Status: AC
  Filled 2019-07-27: qty 1

## 2019-07-27 MED ORDER — ACETAMINOPHEN 500 MG PO TABS
1000.0000 mg | ORAL_TABLET | Freq: Once | ORAL | Status: AC
  Administered 2019-07-27: 1000 mg via ORAL
  Filled 2019-07-27: qty 2

## 2019-07-27 MED ORDER — KETOROLAC TROMETHAMINE 15 MG/ML IJ SOLN
15.0000 mg | INTRAMUSCULAR | Status: AC
  Administered 2019-07-27: 12:00:00 15 mg via INTRAVENOUS
  Filled 2019-07-27: qty 1

## 2019-07-27 MED ORDER — OXYCODONE-ACETAMINOPHEN 5-325 MG PO TABS: 1.0000 | ORAL_TABLET | Freq: Four times a day (QID) | ORAL | 0 refills | Status: DC | PRN

## 2019-07-27 MED ORDER — SODIUM CHLORIDE 0.9 % IR SOLN
Status: DC | PRN
  Administered 2019-07-27: 3000 mL

## 2019-07-27 MED ORDER — MIDAZOLAM HCL 5 MG/5ML IJ SOLN
INTRAMUSCULAR | Status: DC | PRN
  Administered 2019-07-27: 2 mg via INTRAVENOUS

## 2019-07-27 MED ORDER — OXYCODONE HCL 5 MG PO TABS
5.0000 mg | ORAL_TABLET | Freq: Once | ORAL | Status: AC
  Administered 2019-07-27: 5 mg via ORAL
  Filled 2019-07-27: qty 1

## 2019-07-27 MED ORDER — PROPOFOL 10 MG/ML IV BOLUS
INTRAVENOUS | Status: DC | PRN
  Administered 2019-07-27: 160 mg via INTRAVENOUS
  Administered 2019-07-27: 50 mg via INTRAVENOUS

## 2019-07-27 MED ORDER — MIDAZOLAM HCL 2 MG/2ML IJ SOLN
INTRAMUSCULAR | Status: AC
  Filled 2019-07-27: qty 2

## 2019-07-27 MED ORDER — FENTANYL CITRATE (PF) 100 MCG/2ML IJ SOLN
INTRAMUSCULAR | Status: DC | PRN
  Administered 2019-07-27 (×2): 50 ug via INTRAVENOUS

## 2019-07-27 MED ORDER — PROMETHAZINE HCL 25 MG/ML IJ SOLN
6.2500 mg | INTRAMUSCULAR | Status: DC | PRN
  Filled 2019-07-27: qty 1

## 2019-07-27 MED ORDER — LIDOCAINE 2% (20 MG/ML) 5 ML SYRINGE
INTRAMUSCULAR | Status: DC | PRN
  Administered 2019-07-27: 100 mg via INTRAVENOUS

## 2019-07-27 SURGICAL SUPPLY — 14 items
ABLATOR SURESOUND NOVASURE (ABLATOR) ×3 IMPLANT
CANISTER SUCT 3000ML PPV (MISCELLANEOUS) ×3 IMPLANT
CATH ROBINSON RED A/P 16FR (CATHETERS) ×3 IMPLANT
COVER WAND RF STERILE (DRAPES) ×3 IMPLANT
GAUZE 4X4 16PLY RFD (DISPOSABLE) ×3 IMPLANT
GLOVE BIO SURGEON STRL SZ7 (GLOVE) ×3 IMPLANT
GLOVE BIOGEL PI IND STRL 7.0 (GLOVE) ×2 IMPLANT
GLOVE BIOGEL PI INDICATOR 7.0 (GLOVE) ×4
GOWN STRL REUS W/TWL LRG LVL3 (GOWN DISPOSABLE) ×3 IMPLANT
GOWN STRL REUS W/TWL XL LVL3 (GOWN DISPOSABLE) ×3 IMPLANT
KIT PROCEDURE FLUENT (KITS) ×3 IMPLANT
PACK VAGINAL MINOR WOMEN LF (CUSTOM PROCEDURE TRAY) ×3 IMPLANT
PAD OB MATERNITY 4.3X12.25 (PERSONAL CARE ITEMS) ×3 IMPLANT
TOWEL OR 17X26 10 PK STRL BLUE (TOWEL DISPOSABLE) ×6 IMPLANT

## 2019-07-27 NOTE — Transfer of Care (Signed)
Immediate Anesthesia Transfer of Care Note  Patient: Melissa Aguirre  Procedure(s) Performed: DILATATION & CURETTAGE/HYSTEROSCOPY WITH NOVASURE ABLATION - POSSIBLE POLYPECTOMY (N/A )  Patient Location: PACU  Anesthesia Type:General  Level of Consciousness: awake, oriented and patient cooperative  Airway & Oxygen Therapy: Patient Spontanous Breathing and Patient connected to face mask oxygen  Post-op Assessment: Report given to RN and Post -op Vital signs reviewed and stable  Post vital signs: Reviewed and stable  Last Vitals:  Vitals Value Taken Time  BP 141/99 07/27/19 1251  Temp    Pulse 88 07/27/19 1254  Resp 17 07/27/19 1254  SpO2 97 % 07/27/19 1254  Vitals shown include unvalidated device data.  Last Pain:  Vitals:   07/27/19 1132  TempSrc:   PainSc: 6       Patients Stated Pain Goal: 4 (07/27/19 1132)  Complications: No apparent anesthesia complications

## 2019-07-27 NOTE — Anesthesia Procedure Notes (Signed)
Procedure Name: LMA Insertion Date/Time: 07/27/2019 11:57 AM Performed by: Jorene Minors, CRNA Pre-anesthesia Checklist: Patient identified, Emergency Drugs available, Suction available and Patient being monitored Patient Re-evaluated:Patient Re-evaluated prior to induction Oxygen Delivery Method: Circle system utilized Preoxygenation: Pre-oxygenation with 100% oxygen Induction Type: IV induction LMA: LMA inserted LMA Size: 4.0 Tube type: Oral Number of attempts: 1 Airway Equipment and Method: Stylet and Oral airway Placement Confirmation: ETT inserted through vocal cords under direct vision,  positive ETCO2 and breath sounds checked- equal and bilateral Tube secured with: Tape Dental Injury: Teeth and Oropharynx as per pre-operative assessment

## 2019-07-27 NOTE — Anesthesia Preprocedure Evaluation (Addendum)
Anesthesia Evaluation  Patient identified by MRN, date of birth, ID band Patient awake    Reviewed: Allergy & Precautions, NPO status , Patient's Chart, lab work & pertinent test results  Airway Mallampati: II  TM Distance: >3 FB Neck ROM: Full    Dental  (+) Teeth Intact, Dental Advisory Given   Pulmonary Patient abstained from smoking., former smoker,    Pulmonary exam normal breath sounds clear to auscultation       Cardiovascular hypertension, Pt. on medications Normal cardiovascular exam Rhythm:Regular Rate:Normal     Neuro/Psych negative neurological ROS     GI/Hepatic Neg liver ROS, GERD  Medicated,  Endo/Other  Obesity   Renal/GU negative Renal ROS     Musculoskeletal negative musculoskeletal ROS (+)   Abdominal   Peds  Hematology negative hematology ROS (+)   Anesthesia Other Findings Day of surgery medications reviewed with the patient.  Reproductive/Obstetrics                            Anesthesia Physical Anesthesia Plan  ASA: II  Anesthesia Plan: General   Post-op Pain Management:    Induction: Intravenous  PONV Risk Score and Plan: 3 and Midazolam, Dexamethasone and Ondansetron  Airway Management Planned: LMA  Additional Equipment:   Intra-op Plan:   Post-operative Plan: Extubation in OR  Informed Consent: I have reviewed the patients History and Physical, chart, labs and discussed the procedure including the risks, benefits and alternatives for the proposed anesthesia with the patient or authorized representative who has indicated his/her understanding and acceptance.     Dental advisory given  Plan Discussed with: CRNA  Anesthesia Plan Comments:         Anesthesia Quick Evaluation

## 2019-07-27 NOTE — Anesthesia Postprocedure Evaluation (Signed)
Anesthesia Post Note  Patient: NURAH PETRIDES  Procedure(s) Performed: DILATATION & CURETTAGE/HYSTEROSCOPY WITH NOVASURE ABLATION - POSSIBLE POLYPECTOMY (N/A )     Patient location during evaluation: PACU Anesthesia Type: General Level of consciousness: awake and alert Pain management: pain level controlled Vital Signs Assessment: post-procedure vital signs reviewed and stable Respiratory status: spontaneous breathing, nonlabored ventilation and respiratory function stable Cardiovascular status: blood pressure returned to baseline and stable Postop Assessment: no apparent nausea or vomiting Anesthetic complications: no    Last Vitals:  Vitals:   07/27/19 1345 07/27/19 1400  BP: 134/87 128/87  Pulse: 80 83  Resp: 11 10  Temp:    SpO2: 92% 93%    Last Pain:  Vitals:   07/27/19 1345  TempSrc:   PainSc: 5                  Cecile Hearing

## 2019-07-27 NOTE — Brief Op Note (Signed)
07/27/2019  12:57 PM  PATIENT:  Melissa Aguirre  44 y.o. female  PRE-OPERATIVE DIAGNOSIS:  ABNORMAL UTERINE BLEEDING  POST-OPERATIVE DIAGNOSIS:  ABNORMAL UTERINE BLEEDING  PROCEDURE:  Procedure(s): DILATATION & CURETTAGE/HYSTEROSCOPY WITH NOVASURE ABLATION - POSSIBLE POLYPECTOMY (N/A)  SURGEON:  Surgeon(s) and Role:    Willodean Rosenthal, MD - Primary  ANESTHESIA:   general and paracervical block  EBL:  20 mL   BLOOD ADMINISTERED:none  DRAINS: none   LOCAL MEDICATIONS USED:  MARCAINE     SPECIMEN:  Source of Specimen:  endometrial curettings  DISPOSITION OF SPECIMEN:  PATHOLOGY  COUNTS:  YES  TOURNIQUET:  * No tourniquets in log *  DICTATION: .Note written in EPIC  PLAN OF CARE: Discharge to home after PACU  PATIENT DISPOSITION:  PACU - hemodynamically stable.   Delay start of Pharmacological VTE agent (>24hrs) due to surgical blood loss or risk of bleeding: not applicable  Complications: none immediate  [place] Natarajan L. Harraway-Smith, M.D., [facility]

## 2019-07-27 NOTE — Op Note (Signed)
07/27/2019  12:57 PM  PATIENT:  Melissa Aguirre  44 y.o. female  PRE-OPERATIVE DIAGNOSIS:  ABNORMAL UTERINE BLEEDING  POST-OPERATIVE DIAGNOSIS:  ABNORMAL UTERINE BLEEDING  PROCEDURE:  Procedure(s): DILATATION & CURETTAGE/HYSTEROSCOPY WITH NOVASURE ABLATION - POSSIBLE POLYPECTOMY (N/A)  SURGEON:  Surgeon(s) and Role:    Willodean Rosenthal, MD - Primary  ANESTHESIA:   general and paracervical block  EBL:  20 mL   BLOOD ADMINISTERED:none  DRAINS: none   LOCAL MEDICATIONS USED:  MARCAINE     SPECIMEN:  Source of Specimen:  endometrial curettings  DISPOSITION OF SPECIMEN:  PATHOLOGY  COUNTS:  YES  TOURNIQUET:  * No tourniquets in log *  DICTATION: .Note written in EPIC  PLAN OF CARE: Discharge to home after PACU  PATIENT DISPOSITION:  PACU - hemodynamically stable.   Delay start of Pharmacological VTE agent (>24hrs) due to surgical blood loss or risk of bleeding: not applicable  Complications: none immediate   The risks, benefits, and alternatives of surgery were explained, understood, and accepted. The consents were signed and all questions were answered. She was taken to the operating room and general anesthesia was applied without complication. She was placed in the dorsal lithotomy position and her vagina and abdomen were prepped and draped in the usual sterile fashion. A bimanual exam revealed a normal size and shape anteverted mobile uterus. Her adnexa were non-enlarged.   A bivalved speculum was placed in the patients' vagina and the anterior lip of the cervix was grasped with a single toothed tenaculum. A paracervical block was performed at 5 and 7 o'clock with 20cc of 0.5% Marcaine.   The endometrial cavity was sounded to 10.5cm and the endocervical length measured 3cm. A hysteroscope was inserted and the endometrium was noted to be thickened with several polyps.  The ostia on both sides were noted.  The scope was removed and a sharp currete was used to scape  the lining of the uterus until a gritty texture was noted throughout.  Specimens were sent to pathology.  The NovaSure device was then inserted and seated using 6.5cm as the cavity length and 4.6cm as the cavity width.  The total activation time was 33sec at a power of 164.  The hysteroscope was reinserted and an even burn pattern was noted to the fundus.  The single toothed tenaculum was removed at the end of the case and no bleeding was noted from the cervix.   The patient was extubated and taken to the recovery room in stable condition.  Sponge, lap and instrument counts were correct.  There were no complications.   Melissa Aguirre, M.D., [facility]

## 2019-07-27 NOTE — H&P (Signed)
Preoperative History and Physical  Melissa Aguirre is a 44 y.o. [id number] here for surgical management of AUB.   Proposed surgery: hysteroscopy with dilatation and curettage, possible polypectomy and endometrial ablation using Novasure.   Past Medical History:  Diagnosis Date  . Abnormal uterine bleeding   . GERD (gastroesophageal reflux disease)   . Hx of LEEP (loop electrosurgical excision procedure) of cervix complicating pregnancy    Pt is not pregnant   . Hypertension   . IBS (irritable bowel syndrome)   . Pre-diabetes   . Scoliosis    Past Surgical History:  Procedure Laterality Date  . CHOLECYSTECTOMY    . LEEP    . TUBAL LIGATION     OB History    Gravida  5   Para  4   Term  4   Preterm      AB  1   Living  1     SAB  1   TAB      Ectopic      Multiple      Live Births  1          Patient denies any cervical dysplasia or STIs. Medications Prior to Admission  Medication Sig Dispense Refill Last Dose  . amLODipine (NORVASC) 10 MG tablet Take 10 mg by mouth daily.   07/27/2019 at 1000  . aspirin 325 MG tablet Take 325 mg by mouth daily as needed.   Past Week at Unknown time  . Cyclobenzaprine HCl (FLEXERIL PO) Take by mouth. Takes 1/2 of 10mg  prn   Past Week at Unknown time  . famotidine (PEPCID) 10 MG tablet Take 10 mg by mouth 2 (two) times daily.   07/27/2019 at 1000  . losartan (COZAAR) 100 MG tablet Take 100 mg by mouth daily.   07/26/2019 at Unknown time  . omeprazole (PRILOSEC) 10 MG capsule Take 10 mg by mouth daily.   07/26/2019 at Unknown time  . simethicone (MYLICON) 80 MG chewable tablet Chew 80 mg by mouth every 6 (six) hours as needed for flatulence.   07/26/2019 at Unknown time    Allergies  Allergen Reactions  . Shellfish Allergy     Headache, nausea, see spots  . Sulfa Antibiotics     Childhood allergy   Social History:   reports that she quit smoking about 4 years ago. Her smoking use included cigarettes. She has a 7.50 pack-year  smoking history. She has never used smokeless tobacco. She reports current alcohol use. She reports that she does not use drugs. History reviewed. No pertinent family history.  Review of Systems: Noncontributory  PHYSICAL EXAM: Blood pressure (!) 136/94, pulse 89, temperature 98.1 F (36.7 C), temperature source Oral, resp. rate 18, height 5\' 8"  (1.727 m), weight 102.1 kg, SpO2 100 %. General appearance - alert, well appearing, and in no distress Chest - clear to auscultation, no wheezes, rales or rhonchi, symmetric air entry Heart - normal rate and regular rhythm Abdomen - soft, nontender, nondistended, no masses or organomegaly Pelvic - examination not indicated Extremities - peripheral pulses normal, no pedal edema, no clubbing or cyanosis  Labs: Results for orders placed or performed during the hospital encounter of 07/24/19 (from the past 336 hour(s))  SARS CORONAVIRUS 2 (TAT 6-24 HRS) Nasopharyngeal Nasopharyngeal Swab   Collection Time: 07/24/19 12:31 PM   Specimen: Nasopharyngeal Swab  Result Value Ref Range   SARS Coronavirus 2 NEGATIVE NEGATIVE    Imaging Studies: No results found.  Assessment: Patient Active  Problem List   Diagnosis Date Noted  . Abnormal uterine bleeding (AUB) 07/14/2019    Plan: Patient will undergo surgical management with hysteroscopy with dilatation and curettage, possible polypectomy and endometrial ablation using Novasure.   The risks of surgery were discussed in detail with the patient including but not limited to: bleeding which may require transfusion or reoperation; infection which may require antibiotics; injury to surrounding organs which may involve bowel, bladder, ureters ; need for additional procedures including laparoscopy or laparotomy; thromboembolic phenomenon, surgical site problems and other postoperative/anesthesia complications. Likelihood of success in alleviating the patient's condition was discussed. Routine postoperative  instructions will be reviewed with the patient and her family in detail after surgery.  The patient concurred with the proposed plan, giving informed written consent for the surgery.  Patient has been NPO since last night she will remain NPO for procedure.  Anesthesia and OR aware.  Preoperative prophylactic antibiotics and SCDs ordered on call to the OR.  To OR when ready.  Jamarie Mussa L. Erin Fulling, M.D., [facility] 07/27/2019 11:37 AM

## 2019-07-27 NOTE — Discharge Instructions (Signed)
Endometrial Ablation, Care After This sheet gives you information about how to care for yourself after your procedure. Your health care provider may also give you more specific instructions. If you have problems or questions, contact your health care provider. What can I expect after the procedure? After the procedure, it is common to have:  A need to urinate more frequently than usual for the first 24 hours.  Cramps similar to menstrual cramps. These may last for 1-2 days.  Thin, watery vaginal discharge that is light pink or brown in color. This may last a few weeks. Discharge will be heavy for the first few days after your procedure. You may need to wear a sanitary pad.  Nausea.  Vaginal bleeding for 4-6 weeks after the procedure, as tissue healing occurs. Follow these instructions at home: Activity  Do not drive for 24 hours if you were given amedicine to help you relax (sedative) during your procedure.  Do not have sex or put anything into your vagina until your health care provider approves.      Do not lift anything that is heavier than 10 lb (4.5 kg), or the limit that you are told, until your health care provider says that it is safe.  Return to your normal activities as told by your health care provider. Ask your health care provider what activities are safe for you. General instructions   Take over-the-counter and prescription medicines only as told by your health care provider.  Do not take baths, swim, or use a hot tub until your health care provider approves. You will be able to take showers.  Check your vaginal area every day for signs of infection. Check for: ? Redness, swelling, or pain. ? More discharge or blood, instead of less. ? Bad-smelling discharge.  Keep all follow-up visits as told by your health care provider. This is important.  Drink enough fluid to keep your urine pale yellow. Contact a health care provider if you have:  Vaginal redness, swelling,  or pain.  Vaginal discharge or bleeding that gets worse instead of getting better.  Bad-smelling vaginal discharge.  A fever or chills.  Trouble urinating. Get help right away if you have:  Heavy vaginal bleeding.  Severe cramps. Summary  After endometrial ablation, it is normal to have thin, watery vaginal discharge that is light pink or brown in color. This may last a few weeks and may be heavier right after the procedure.  Vaginal bleeding is also normal after the procedure and should get better with time.  Check your vaginal area every day for signs of infection, such as bad-smelling discharge.  Keep all follow-up visits as told by your health care provider. This is important. This information is not intended to replace advice given to you by your health care provider. Make sure you discuss any questions you have with your health care provider. Document Revised: 10/29/2018 Document Reviewed: 05/20/2017 Elsevier Patient Education  2020 [employer]     Post Anesthesia Home Care Instructions  Activity: Get plenty of rest for the remainder of the day. A responsible individual must stay with you for 24 hours following the procedure.  For the next 24 hours, DO NOT: -Drive a car -Operate machinery -Drink alcoholic beverages -Take any medication unless instructed by your physician -Make any legal decisions or sign important papers.  Meals: Start with liquid foods such as gelatin or soup. Progress to regular foods as tolerated. Avoid greasy, spicy, heavy foods. If nausea and/or vomiting occur,   drink only clear liquids until the nausea and/or vomiting subsides. Call your physician if vomiting continues.  Special Instructions/Symptoms: Your throat may feel dry or sore from the anesthesia or the breathing tube placed in your throat during surgery. If this causes discomfort, gargle with warm salt water. The discomfort should disappear within 24 hours.    .     

## 2019-07-28 LAB — SURGICAL PATHOLOGY

## 2019-08-23 ENCOUNTER — Other Ambulatory Visit: Payer: Self-pay

## 2019-08-25 ENCOUNTER — Other Ambulatory Visit: Payer: Self-pay

## 2019-08-25 ENCOUNTER — Encounter: Payer: Self-pay | Admitting: Obstetrics & Gynecology

## 2019-08-25 ENCOUNTER — Ambulatory Visit (INDEPENDENT_AMBULATORY_CARE_PROVIDER_SITE_OTHER): Payer: Medicaid Other | Admitting: Obstetrics & Gynecology

## 2019-08-25 VITALS — BP 129/86 | HR 95 | Wt 221.0 lb

## 2019-08-25 DIAGNOSIS — Z48816 Encounter for surgical aftercare following surgery on the genitourinary system: Secondary | ICD-10-CM | POA: Diagnosis not present

## 2019-08-25 DIAGNOSIS — Z9889 Other specified postprocedural states: Secondary | ICD-10-CM

## 2019-08-25 NOTE — Progress Notes (Signed)
Patient presents for four week post op D&C with Novasure ablation. [name] RN

## 2019-08-25 NOTE — Progress Notes (Signed)
History:  44 y.o. [id number] here today for 4 week post op check. Pt denies bleeding or problems. She reports that she has had no bleeding since the procedure.   She denies pain.   The following portions of the patient's history were reviewed and updated as appropriate: allergies, current medications, past family history, past medical history, past social history, past surgical history and problem list.  Review of Systems:  Pertinent items are noted in HPI.    Objective:  Physical Exam Blood pressure 129/86, pulse 95, weight 221 lb (100.2 kg). CONSTITUTIONAL: Well-developed, well-nourished female in no acute distress.  HENT:  Normocephalic, atraumatic EYES: Conjunctivae and EOM are normal. No scleral icterus.  NECK: Normal range of motion SKIN: Skin is warm and dry. No rash noted. Not diaphoretic.No pallor. NEUROLGIC: Alert and oriented to person, place, and time. Normal coordination.  Pelvic: deferred  Labs and Imaging 07/27/2019 ENDOMETRIUM, CURETTAGE:  - Benign endometrial type polyp.  - Benign squamous and endocervical mucosa  Assessment & Plan:  4 weeks post op check- doing well  rec f/u in 1 year for annual or sooner prn  Justene Jensen L. Harraway-Smith, M.D., [facility]

## 2020-07-20 ENCOUNTER — Other Ambulatory Visit: Payer: Self-pay | Admitting: Family Medicine

## 2020-07-20 DIAGNOSIS — Z1231 Encounter for screening mammogram for malignant neoplasm of breast: Secondary | ICD-10-CM

## 2020-08-29 ENCOUNTER — Ambulatory Visit
Admission: RE | Admit: 2020-08-29 | Discharge: 2020-08-29 | Disposition: A | Discharge status: Home / Self Care | Payer: Medicaid Other | Source: Ambulatory Visit | Attending: Family Medicine | Admitting: Family Medicine

## 2020-08-29 ENCOUNTER — Other Ambulatory Visit: Payer: Self-pay

## 2020-08-29 ENCOUNTER — Ambulatory Visit: Payer: Medicaid Other

## 2020-08-29 DIAGNOSIS — Z1231 Encounter for screening mammogram for malignant neoplasm of breast: Secondary | ICD-10-CM

## 2021-09-05 ENCOUNTER — Encounter: Payer: Self-pay | Admitting: General Practice

## 2021-09-18 ENCOUNTER — Other Ambulatory Visit: Payer: Self-pay | Admitting: Family Medicine

## 2021-09-18 DIAGNOSIS — Z1231 Encounter for screening mammogram for malignant neoplasm of breast: Secondary | ICD-10-CM

## 2021-10-03 ENCOUNTER — Ambulatory Visit: Payer: Medicaid Other | Admitting: Obstetrics & Gynecology

## 2021-10-31 ENCOUNTER — Ambulatory Visit: Payer: Medicaid Other | Admitting: Obstetrics & Gynecology

## 2021-12-19 ENCOUNTER — Ambulatory Visit: Payer: Medicaid Other | Admitting: Obstetrics & Gynecology

## 2022-01-23 ENCOUNTER — Ambulatory Visit: Payer: Medicaid Other | Admitting: Obstetrics & Gynecology

## 2022-03-29 ENCOUNTER — Telehealth: Payer: Self-pay | Admitting: General Practice

## 2022-03-29 NOTE — Telephone Encounter (Signed)
Left message on VM informing pt that her appt on 04/03/2022 will need to be rescheduled d/t provider will be on medical leave.  Asked pt to contact our office to reschedule. 

## 2022-04-03 ENCOUNTER — Ambulatory Visit: Payer: Medicaid Other | Admitting: Obstetrics & Gynecology

## 2022-06-05 ENCOUNTER — Ambulatory Visit: Payer: Medicaid Other | Admitting: Obstetrics & Gynecology

## 2022-08-07 ENCOUNTER — Ambulatory Visit: Payer: Medicaid Other | Admitting: Obstetrics & Gynecology

## 2022-08-07 ENCOUNTER — Other Ambulatory Visit (HOSPITAL_COMMUNITY)
Admission: RE | Admit: 2022-08-07 | Discharge: 2022-08-07 | Disposition: A | Discharge status: Home / Self Care | Payer: Medicaid Other | Source: Ambulatory Visit | Attending: Obstetrics & Gynecology | Admitting: Obstetrics & Gynecology

## 2022-08-07 ENCOUNTER — Encounter: Payer: Self-pay | Admitting: Obstetrics & Gynecology

## 2022-08-07 VITALS — BP 135/82 | HR 87 | Ht 68.0 in | Wt 221.0 lb

## 2022-08-07 DIAGNOSIS — N946 Dysmenorrhea, unspecified: Secondary | ICD-10-CM | POA: Diagnosis not present

## 2022-08-07 DIAGNOSIS — N852 Hypertrophy of uterus: Secondary | ICD-10-CM | POA: Diagnosis not present

## 2022-08-07 DIAGNOSIS — Z01419 Encounter for gynecological examination (general) (routine) without abnormal findings: Secondary | ICD-10-CM | POA: Diagnosis present

## 2022-08-07 DIAGNOSIS — N920 Excessive and frequent menstruation with regular cycle: Secondary | ICD-10-CM

## 2022-08-07 NOTE — Progress Notes (Signed)
Last Pap smear in Nov 2020. Patient states her periods are still heavy on day one and two, she does state that her periods have shortened in length after the ablation in 2021. Patient had last mammogram Jan 2024 at [facility]. Kathrene Alu RN

## 2022-08-07 NOTE — Patient Instructions (Signed)
Endometrial Ablation Endometrial ablation is a procedure that destroys the thin inner layer of the lining of the uterus (endometrium). This procedure may be done: To stop heavy menstrual periods. To stop bleeding that is causing anemia. To control irregular bleeding. To treat bleeding caused by small tumors (fibroids) in the endometrium. This procedure is often done as an alternative to major surgery, such as removal of the uterus and cervix (hysterectomy). As a result of this procedure: You may not be able to have children. However, if you have not yet gone through menopause: You may still have a small chance of getting pregnant. You will need to use a reliable method of birth control after the procedure to prevent pregnancy. You may stop having a menstrual period, or you may have only a small amount of bleeding during your period. Menstruation may return several years after the procedure. Tell a health care provider about: Any allergies you have. All medicines you are taking, including vitamins, herbs, eye drops, creams, and over-the-counter medicines. Any problems you or family members have had with the use of anesthetic medicines. Any blood disorders you have. Any surgeries you have had. Any medical conditions you have. Whether you are pregnant or may be pregnant. What are the risks? Generally, this is a safe procedure. However, problems may occur, including: A hole (perforation) in the uterus or bowel. Infection in the uterus, bladder, or vagina. Bleeding. Allergic reaction to medicines. Damage to nearby structures or organs. An air bubble in the lung (air embolus). Problems with pregnancy. Failure of the procedure. Decreased ability to diagnose cancer in the endometrium. Scar tissue forms after the procedure, making it more difficult to get a sample of the uterine lining. What happens before the procedure? Medicines Ask your health care provider about: Changing or stopping your  regular medicines. This is especially important if you take diabetes medicines or blood thinners. Taking medicines such as aspirin and ibuprofen. These medicines can thin your blood. Do not take these medicines before your procedure if your doctor tells you not to take them. Taking over-the-counter medicines, vitamins, herbs, and supplements. Tests You will have tests of your endometrium to make sure there are no precancerous cells or cancer cells present. You may have an ultrasound of the uterus. General instructions Do not use any products that contain nicotine or tobacco for at least 4 weeks before the procedure. These include cigarettes, chewing tobacco, and vaping devices, such as e-cigarettes. If you need help quitting, ask your health care provider. You may be given medicines to thin the endometrium. Ask your health care provider what steps will be taken to help prevent infection. These steps may include: Removing hair at the surgery site. Washing skin with a germ-killing soap. Taking antibiotic medicine. Plan to have a responsible adult take you home from the hospital or clinic. Plan to have a responsible adult care for you for the time you are told after you leave the hospital or clinic. This is important. What happens during the procedure?  You will lie on an exam table with your feet and legs supported as in a pelvic exam. An IV will be inserted into one of your veins. You will be given a medicine to help you relax (sedative). A surgical tool with a light and camera (resectoscope) will be inserted into your vagina and moved into your uterus. This allows your surgeon to see inside your uterus. Endometrial tissue will be destroyed and removed, using one of the following methods: Radiofrequency. This   uses an electrical current to destroy the endometrium. Cryotherapy. This uses extreme cold to freeze the endometrium. Heated fluid. This uses a heated salt and water (saline) solution to  destroy the endometrium. Microwave. This uses high-energy microwaves to heat up the endometrium and destroy it. Thermal balloon. This involves inserting a catheter with a balloon tip into the uterus. The balloon tip is filled with heated fluid to destroy the endometrium. The procedure may vary among health care providers and hospitals. What happens after the procedure? Your blood pressure, heart rate, breathing rate, and blood oxygen level will be monitored until you leave the hospital or clinic. You may have vaginal bleeding for 4-6 weeks after the procedure. You may also have: Cramps. A thin, watery vaginal discharge that is light pink or brown. A need to urinate more than usual. Nausea. If you were given a sedative during the procedure, it can affect you for several hours. Do not drive or operate machinery until your health care provider says that it is safe. Do not have sex or insert anything into your vagina until your health care provider says it is safe. Summary Endometrial ablation is done to treat many causes of heavy menstrual bleeding. The procedure destroys the thin inner layer of the lining of the uterus (endometrium). This procedure is often done as an alternative to major surgery, such as removal of the uterus and cervix (hysterectomy). Plan to have a responsible adult take you home from the hospital or clinic. This information is not intended to replace advice given to you by your health care provider. Make sure you discuss any questions you have with your health care provider. Document Revised: 01/27/2020 Document Reviewed: 01/27/2020 Elsevier Patient Education  2023 Elsevier Inc.  

## 2022-08-07 NOTE — Progress Notes (Signed)
Subjective:     Melissa Aguirre is a 47 y.o. female here for a routine exam.  Current complaints: Pt reports that since the ablation her menses are much shorter in length however she still has cycles which are 3-5 days with the first couple of days very heavy. She also reports     Gynecologic History Patient's last menstrual period was 07/15/2022. Contraception: tubal ligation 2008 Last Pap: 06/14/2019. Results were: normal Last mammogram: 12/27/2020. Results were: normal  Obstetric History OB History  Gravida Para Term Preterm AB Living  5 4 4   1 1   SAB IAB Ectopic Multiple Live Births  1       1    # Outcome Date GA Lbr Len/2nd Weight Sex Delivery Anes PTL Lv  5 Term 2008 [redacted]w[redacted]d   F Vag-Spont EPI N   4 Term 2006 [redacted]w[redacted]d   M Vag-Spont EPI N   3 SAB 05/2004          2 Term 1999 [redacted]w[redacted]d   F Vag-Spont EPI N   1 Term 1996 [redacted]w[redacted]d   M Vag-Spont EPI N LIV     The following portions of the patient's history were reviewed and updated as appropriate: allergies, current medications, past family history, past medical history, past social history, past surgical history, and problem list.  Review of Systems Pertinent items are noted in HPI.    Objective:  BP 135/82 Comment: Pt saw her pCP yesterday 08/06/22  Pulse 87   Ht 5\' 8"  (1.727 m)   Wt 221 lb (100.2 kg)   LMP 07/15/2022   BMI 33.60 kg/m   General Appearance:    Alert, cooperative, no distress, appears stated age  Head:    Normocephalic, without obvious abnormality, atraumatic  Eyes:    conjunctiva/corneas clear, EOM's intact, both eyes  Ears:    Normal external ear canals, both ears  Nose:   Nares normal, septum midline, mucosa normal, no drainage    or sinus tenderness  Throat:   Lips, mucosa, and tongue normal; teeth and gums normal  Neck:   Supple, symmetrical, trachea midline, no adenopathy;    thyroid:  no enlargement/tenderness/nodules  Back:     Symmetric, no curvature, ROM normal, no CVA tenderness  Lungs:      respirations unlabored  Chest Wall:    No tenderness or deformity   Heart:    Regular rate and rhythm  Breast Exam:    No tenderness, masses, or nipple abnormality  Abdomen:     Soft, non-tender, bowel sounds active all four quadrants,    no masses, no organomegaly  Genitalia:    Normal female without lesion, discharge or tenderness   Uterus sl enlarged 10-12 weeks   Extremities:   Extremities normal, atraumatic, no cyanosis or edema  Pulses:   2+ and symmetric all extremities  Skin:   Skin color, texture, turgor normal, no rashes or lesions     Assessment:    Healthy female exam.  Heavy menses assoc with pain. Pt sx are improved since her endometrial ablation however. The dysmenorrhea is a known side effect of the endometrial ablation. Pt is not sure she if she will wait until menopause for sx relief. Her fear is that her mother went through menopause at a late age. We reviewed the options of [place] vs hysterectomy. Pt wants to get [place] to eval larger size. Pt will decide from there next steps.    Plan:   Melissa Aguirre was seen today for  gynecologic exam.  Diagnoses and all orders for this visit:  Well female exam with routine gynecological exam  Menorrhagia with regular cycle -     US PELVIS TRANSVAGINAL NON-OB (TV ONLY); Future  Dysmenorrhea -     US PELVIS TRANSVAGINAL NON-OB (TV ONLY); Future  Enlarged uterus   F/u via MyChart after [place].  F/u in 1 year for annual.   Melissa Aguirre L. Harraway-Smith, M.D., [facility]

## 2022-08-09 LAB — CYTOLOGY - PAP
Comment: NEGATIVE
Diagnosis: NEGATIVE
High risk HPV: NEGATIVE

## 2022-08-15 ENCOUNTER — Ambulatory Visit (HOSPITAL_BASED_OUTPATIENT_CLINIC_OR_DEPARTMENT_OTHER)
Admission: RE | Admit: 2022-08-15 | Discharge: 2022-08-15 | Disposition: A | Discharge status: Home / Self Care | Payer: Medicaid Other | Source: Ambulatory Visit | Attending: Obstetrics & Gynecology | Admitting: Obstetrics & Gynecology

## 2022-08-15 ENCOUNTER — Other Ambulatory Visit: Payer: Self-pay

## 2022-08-15 DIAGNOSIS — N939 Abnormal uterine and vaginal bleeding, unspecified: Secondary | ICD-10-CM | POA: Diagnosis not present

## 2022-08-15 DIAGNOSIS — N920 Excessive and frequent menstruation with regular cycle: Secondary | ICD-10-CM

## 2022-08-15 DIAGNOSIS — N852 Hypertrophy of uterus: Secondary | ICD-10-CM

## 2022-08-15 DIAGNOSIS — N946 Dysmenorrhea, unspecified: Secondary | ICD-10-CM

## 2022-08-28 ENCOUNTER — Telehealth: Payer: Self-pay

## 2022-08-28 NOTE — Telephone Encounter (Signed)
Called patient to inform her of her [place] results. Left message for patient to call the office back. Melissa Aguirre l Lanijah Warzecha, CMA

## 2022-08-28 NOTE — Telephone Encounter (Signed)
-----   Message from Lavonia Drafts, MD sent at 08/28/2022  3:28 PM EST ----- Please call pt. Her uterus is NOT appreciably larger however her endometrium is thickened and she does need an endo bx.   THx,  Clh-S

## 2022-10-23 ENCOUNTER — Encounter: Payer: Self-pay | Admitting: Obstetrics & Gynecology

## 2022-10-23 ENCOUNTER — Other Ambulatory Visit (HOSPITAL_COMMUNITY)
Admission: RE | Admit: 2022-10-23 | Discharge: 2022-10-23 | Disposition: A | Discharge status: Home / Self Care | Payer: Medicaid Other | Source: Ambulatory Visit | Attending: Obstetrics & Gynecology | Admitting: Obstetrics & Gynecology

## 2022-10-23 ENCOUNTER — Ambulatory Visit: Payer: Medicaid Other | Admitting: Obstetrics & Gynecology

## 2022-10-23 VITALS — BP 124/79 | HR 86 | Wt 222.0 lb

## 2022-10-23 DIAGNOSIS — N939 Abnormal uterine and vaginal bleeding, unspecified: Secondary | ICD-10-CM

## 2022-10-23 NOTE — Progress Notes (Signed)
History:  47 y.o. [id number] here today for Endo Bx. She has a thickened endometrium on [place] in addition to bleeding.   The following portions of the patient's history were reviewed and updated as appropriate: allergies, current medications, past family history, past medical history, past social history, past surgical history and problem list.  Review of Systems:  Pertinent items are noted in HPI.    Objective:  Physical Exam Blood pressure 124/79, pulse 86, weight 222 lb (100.7 kg), last menstrual period 10/21/2022.  CONSTITUTIONAL: Well-developed, well-nourished female in no acute distress.  HENT:  Normocephalic, atraumatic EYES: Conjunctivae and EOM are normal. No scleral icterus.  NECK: Normal range of motion SKIN: Skin is warm and dry. No rash noted. Not diaphoretic.No pallor. NEUROLGIC: Alert and oriented to person, place, and time. Normal coordination.  Abd: Soft, nontender and nondistended Pelvic: Normal appearing external genitalia; normal appearing vaginal mucosa and cervix.  Normal discharge.  Small uterus, no other palpable masses, no uterine or adnexal tenderness  The indications for endometrial biopsy were reviewed.   Risks of the biopsy including cramping, bleeding, infection, uterine perforation, inadequate specimen and need for additional procedures  were discussed. The patient states she understands and agrees to undergo procedure today. Consent was signed. Time out was performed. Urine HCG was negative. A sterile speculum was placed in the patient's vagina and the cervix was prepped with Betadine. A single-toothed tenaculum was placed on the anterior lip of the cervix to stabilize it. The 3 mm pipelle was introduced into the endometrial cavity without difficulty to a depth of 8cm, and a moderate amount of tissue was obtained and sent to pathology. The instruments were removed from the patient's vagina. Minimal bleeding from the cervix was noted. The patient tolerated the procedure  well. Routine post-procedure instructions were given to the patient. The patient will follow up to review the results and for further management.     Labs and Imaging 08/16/2022 CLINICAL DATA:  Enlarged uterus.   EXAM: TRANSABDOMINAL AND TRANSVAGINAL ULTRASOUND OF PELVIS   TECHNIQUE: Both transabdominal and transvaginal ultrasound examinations of the pelvis were performed. Transabdominal technique was performed for global imaging of the pelvis including uterus, ovaries, adnexal regions, and pelvic cul-de-sac. It was necessary to proceed with endovaginal exam following the transabdominal exam to visualize the endometrium.   COMPARISON:  Pelvic ultrasound 06/21/2019   FINDINGS: Uterus   Measurements: 10.6 x 6.1 x 6.7 cm = volume: 214.5 mL. No fibroids or other mass visualized. Heterogeneity of the myometrium.   Endometrium   Thickness: 19 mm.  No focal abnormality visualized.   Right ovary   Measurements: 3.5 x 2.1 x 2.5 cm = volume: 9.5 mL. Normal appearance/no adnexal mass.   Left ovary   Measurements: 3.6 x 2.5 x 3.1 cm = volume: 14.5 mL. There is a 1.5 x 1.3 x 1.8 cm simple cyst within the left ovary.   Other findings   No abnormal free fluid.   IMPRESSION: Endometrium measures 19 mm. Endometrial thickness is considered abnormal. Consider follow-up by [place] in 6-8 weeks, during the week immediately following menses (exam timing is critical).   1.8 cm left ovarian follicle, normal finding for premenopausal patient (benign simple cyst for postmenopausal patient). No follow-up imaging is recommended.   Reference: Radiology 2019 Nov;293(2):359-371   Heterogeneity of the myometrium raising the possibility of adenomyosis.      Assessment & Plan:  Diagnoses and all orders for this visit:  Abnormal uterine bleeding (AUB) -  Surgical pathology  F/u results.  F/u prn   Rebeccah Ivins L. Harraway-Smith, M.D., [place]

## 2022-10-23 NOTE — Patient Instructions (Signed)
Endometrial Biopsy  An endometrial biopsy is a procedure to remove tissue samples from the endometrium, which is the lining of the uterus. The tissue that is removed can then be checked under a microscope for disease. This procedure is used to diagnose conditions such as endometrial cancer, endometrial tuberculosis, polyps, or other inflammatory conditions. This procedure may also be used to investigate uterine bleeding to determine where you are in your menstrual cycle or how your hormone levels are affecting the lining of the uterus. Tell a health care provider about: Any allergies you have. All medicines you are taking, including vitamins, herbs, eye drops, creams, and over-the-counter medicines. Any problems you or family members have had with anesthetic medicines. Any bleeding problems you have. Any surgeries you have had. Any medical conditions you have. Whether you are pregnant or may be pregnant. What are the risks? Your health care provider will talk with you about risks. These may include: Bleeding. Pelvic infection. Puncture of the wall of the uterus with the biopsy device (rare). Allergic reactions to medicines. What happens before the procedure? Keep a record of your menstrual cycles as told by your health care provider. You may need to schedule your procedure for a specific time in your cycle. Bring a sanitary pad in case you need to wear one after the procedure. Ask your health care provider about: Changing or stopping your regular medicines. These include any diabetes medicines or blood thinners you take. Taking medicines such as aspirin and ibuprofen. These medicines can thin your blood. Do not take these medicines unless your health care provider tells you to. Taking over-the-counter medicines, vitamins, herbs, and supplements. Plan to have someone take you home from the hospital or clinic. What happens during the procedure? You will lie on an exam table with your feet  and legs supported as in a pelvic exam. Your health care provider will insert an instrument into your vagina to see your cervix. Your cervix will be cleansed with an antiseptic solution. A medicine (local anesthetic) will be used to numb the cervix. A forceps instrument will be used to hold your cervix steady for the biopsy. A thin, rod-like instrument (uterine sound) will be inserted through your cervix to determine the length of your uterus and the location where the biopsy sample will be removed. A thin, flexible tube (catheter) will be inserted through your cervix and into the uterus. The catheter will be used to collect the biopsy sample from your endometrial tissue. The tube and instruments will be removed, and the tissue sample will be sent to a lab for examination. The procedure may vary among health care providers and hospitals. What happens after the procedure? Your blood pressure, heart rate, breathing rate, and blood oxygen level will be monitored until you leave the hospital or clinic. It is up to you to get the results of your procedure. Ask your health care provider, or the department that is doing the procedure, when your results will be ready. Summary An endometrial biopsy is a procedure to remove tissue samples from the endometrium, which is the lining of the uterus. This procedure is used to diagnose conditions such as endometrial cancer, endometrial tuberculosis, polyps, or other inflammatory conditions. It is up to you to get the results of your procedure. Ask your health care provider, or the department that is doing the procedure, when your results will be ready. This information is not intended to replace advice given to you by your health care provider. Make sure you  discuss any questions you have with your health care provider. Document Revised: 10/23/2021 Document Reviewed: 10/23/2021 Elsevier Patient Education  [place]. Hysterectomy Information  A  hysterectomy is a surgery in which the uterus is removed. The lowest part of the uterus (cervix), which opens into the vagina, may be removed as well. In some cases, the fallopian tubes, the ovaries,  or both the fallopian tubes and the ovaries may also be removed. This procedure may be done to treat different medical problems. It may also be done to help transgender men feel more masculine. After the procedure, a woman will no longer have menstrual periods and will not be able to become pregnant (sterile). What are the reasons for a hysterectomy? There are many reasons why a person might have this procedure. They include: Persistent, abnormal vaginal bleeding. Long-term (chronic) pelvic pain or infection. Endometriosis. This is when the lining of the uterus (endometrium) starts to grow outside the uterus. Adenomyosis. This is when the endometrium starts to grow in the muscle of the uterus. Pelvic organ prolapse. This is a condition in which the uterus falls down into the vagina. Noncancerous growths in the uterus (uterine fibroids) that cause symptoms. The presence of precancerous cells. Cervical or uterine cancer. Sex change. This helps a transgender man complete his female identity. What are the different types of hysterectomy? There are three different types of hysterectomy: Supracervical hysterectomy. In this type, the top part of the uterus is removed, but not the cervix. Total hysterectomy. In this type, the uterus and cervix are removed. Radical hysterectomy. In this type, the uterus, the cervix, and the tissue that holds the uterus in place (parametrium) are removed. What are the different ways a hysterectomy can be performed? There are many different ways a hysterectomy can be performed, including: Abdominal hysterectomy. In this type, an incision is made in the abdomen. The uterus is removed through this incision. Vaginal hysterectomy. In this type, an incision is made in the vagina.  The uterus is removed through this incision. There are no abdominal incisions. Conventional laparoscopic hysterectomy. In this type, 3 or 4 small incisions are made in the abdomen. A thin, lighted tube with a camera (laparoscope) is inserted into one of the incisions. Other tools are put through the other incisions. The uterus is cut into small pieces. The small pieces are removed through the incisions or the vagina. Laparoscopically assisted vaginal hysterectomy (LAVH). In this type, 3 or 4 small incisions are made in the abdomen. Part of the surgery is performed laparoscopically and the other part is done vaginally. The uterus is removed through the vagina. Robot-assisted laparoscopic hysterectomy. In this type, a laparoscope and other tools are inserted into 3 or 4 small incisions in the abdomen. A computer-controlled device is used to give the surgeon a 3D image and to help control the surgical instruments. This allows for more precise movements of surgical instruments. The uterus is cut into small pieces and removed through the incisions or the vagina. Discuss the options with your health care provider to determine which type is the right one for you. What are the risks of this surgery? Generally, this is a safe procedure. However, problems may occur, including: Bleeding and risk of blood transfusion. Tell your health care provider if you do not want to receive any blood products. Blood clots in the legs or lung. Infection. Damage to nearby structures or organs. Allergic reactions to medicines. Having to change to an abdominal hysterectomy after starting a less  invasive technique. What to expect after a hysterectomy You will be given pain medicine. You may need to stay in the hospital for 1-2 days to recover, depending on the type of hysterectomy you had. You will need to have someone with you for the first 3-5 days after you go home. You will need to follow up with your surgeon in 2-4 weeks  after surgery to evaluate your progress. If the ovaries are removed, you will have early menopause symptoms such as hot flashes, night sweats, and insomnia. If you had a hysterectomy for a problem that was not cancer or a condition that could not lead to cancer, then you no longer need Pap tests. However, even if you no longer need a Pap test, get regular pelvic exams to make sure no other problems are developing. Questions to ask your health care provider Is a hysterectomy medically necessary? Do I have other treatment options for my condition? What are my options for hysterectomy procedure? What organs and tissues need to be removed? What are the risks? What are the benefits? How long will I need to stay in the hospital after the procedure? How long will I need to recover at home? What symptoms can I expect after the procedure? Summary A hysterectomy is a surgery in which the uterus is removed. The fallopian tubes, the ovaries, or both may be removed as well. This procedure may be done to treat different medical problems. It may also be done to complete your female identity during a sex change. After the procedure, a woman will no longer have a menstrual period and will not be able to become pregnant. There are three types of hysterectomy. Discuss with your health care provider which options are right for you. This is a safe procedure, though there are some risks. Risks include infection, bleeding, blood clots, or damage to nearby organs. This information is not intended to replace advice given to you by your health care provider. Make sure you discuss any questions you have with your health care provider. Document Revised: 05/07/2019 Document Reviewed: 05/07/2019 Elsevier Patient Education  [place].

## 2022-10-25 LAB — SURGICAL PATHOLOGY

## 2022-11-03 ENCOUNTER — Encounter: Payer: Self-pay | Admitting: Obstetrics & Gynecology

## 2022-11-04 ENCOUNTER — Telehealth: Payer: Self-pay

## 2022-11-04 NOTE — Telephone Encounter (Signed)
-----   Message from Willodean Rosenthal, MD sent at 11/03/2022  4:34 PM EDT ----- Please call pt. Her endo bx was neg.   Thanks,   Clh-S

## 2022-11-04 NOTE — Telephone Encounter (Signed)
Patient called and left message to return call to office. Brandis Wixted RN 

## 2022-11-20 ENCOUNTER — Encounter: Payer: Self-pay | Admitting: Obstetrics & Gynecology

## 2022-11-20 ENCOUNTER — Ambulatory Visit (INDEPENDENT_AMBULATORY_CARE_PROVIDER_SITE_OTHER): Payer: Medicaid Other | Admitting: Obstetrics & Gynecology

## 2022-11-20 VITALS — BP 132/80 | HR 90 | Ht 68.5 in | Wt 220.0 lb

## 2022-11-20 DIAGNOSIS — N939 Abnormal uterine and vaginal bleeding, unspecified: Secondary | ICD-10-CM

## 2022-11-20 DIAGNOSIS — N8003 Adenomyosis of the uterus: Secondary | ICD-10-CM

## 2022-11-20 NOTE — Patient Instructions (Signed)
Vaginal Hysterectomy  A vaginal hysterectomy is a procedure to remove all or part of the uterus through a small incision in the vagina. In this procedure, your health care provider may remove your entire uterus, including the cervix. The cervix is the opening and bottom part of the uterus and is located between the vagina and the uterus. Sometimes, the ovaries and fallopian tubes are also removed. This surgery may be done to treat problems such as: Noncancerous growths in the uterus (uterine fibroids) that cause symptoms. A condition that causes the lining of the uterus to grow in other areas (endometriosis). Problems with pelvic support. Cancer of the cervix, ovaries, uterus, or tissue that lines the uterus (endometrium). Excessive bleeding in the uterus. When removing your uterus, your health care provider may also remove the ovaries and the fallopian tubes. After this procedure, you will no longer be able to have a baby, and you will no longer have a menstrual period. Tell a health care provider about: Any allergies you have. All medicines you are taking, including vitamins, herbs, eye drops, creams, and over-the-counter medicines. Any problems you or family members have had with anesthetic medicines. Any blood disorders you have. Any surgeries you have had. Any medical conditions you have. Whether you are pregnant or may be pregnant. What are the risks? Generally, this is a safe procedure. However, problems may occur, including: Bleeding. Infection. Blood clots in the legs or lungs. Damage to nearby structures or organs. Pain during sex. Allergic reactions to medicines. What happens before the procedure? Staying hydrated Follow instructions from your health care provider about hydration, which may include: Up to 2 hours before the procedure - you may continue to drink clear liquids, such as water, clear fruit juice, black coffee, and plain tea.  Eating and drinking  restrictions Follow instructions from your health care provider about eating and drinking, which may include: 8 hours before the procedure - stop eating heavy meals or foods, such as meat, fried foods, or fatty foods. 6 hours before the procedure - stop eating light meals or foods, such as toast or cereal. 6 hours before the procedure - stop drinking milk or drinks that contain milk. 2 hours before the procedure - stop drinking clear liquids. Medicines Take over-the-counter and prescription medicines only as told by your health care provider. You may be asked to take a medicine to empty your colon (bowel preparation). General instructions If you were asked to do a bowel preparation before the procedure, follow instructions from your health care provider. This procedure can affect the way you feel about yourself. Talk with your health care provider about the physical and emotional changes hysterectomy may cause. Do not use any products that contain nicotine or tobacco for at least 4 weeks before the procedure. These products include cigarettes, e-cigarettes, and chewing tobacco. If you need help quitting, ask your health care provider. Plan to have a responsible adult take you home from the hospital or clinic. Plan to have a responsible adult care for you for the time you are told after you leave the hospital or clinic. This is important. Surgery safety Ask your health care provider: How your surgery site will be marked. What steps will be taken to help prevent infection. These may include: Removing hair at the surgery site. Washing skin with a germ-killing soap. Receiving antibiotic medicine. What happens during the procedure? An IV will be inserted into one of your veins. You will be given one or more of the following:  A medicine to help you relax (sedative). A medicine to numb the area (local anesthetic). A medicine to make you fall asleep (general anesthetic). A medicine that is  injected into your spine to numb the area below and slightly above the injection site (spinal anesthetic). A medicine that is injected into an area of your body to numb everything below the injection site (regional anesthetic). Your surgeon will make an incision in your vagina. Your surgeon will locate and remove all or part of your uterus. Part or all of the uterus will be removed through the vagina. Your ovaries and fallopian tubes may be removed at the same time. The incision in your vagina will be closed with stitches (sutures) that dissolve over time. The procedure may vary among health care providers and hospitals. What happens after the procedure? Your blood pressure, heart rate, breathing rate, and blood oxygen level will be monitored until you leave the hospital or clinic. You will be encouraged to walk as soon as possible. You will also use a device or do breathing exercises to keep your lungs clear. You may have to wear compression stockings. These stockings help to prevent blood clots and reduce swelling in your legs. You will be given pain medicine as needed. You will need to wear a sanitary pad for vaginal discharge or bleeding. Summary A vaginal hysterectomy is a procedure to remove all or part of the uterus through the vagina. You may need a vaginal hysterectomy to treat a variety of abnormalities of the uterus. Plan to have a responsible adult take you home from the hospital or clinic. Plan to have a responsible adult care for you for the time you are told after you leave the hospital or clinic. This is important. This information is not intended to replace advice given to you by your health care provider. Make sure you discuss any questions you have with your health care provider. Document Revised: 09/19/2021 Document Reviewed: 03/10/2020 Elsevier Patient Education  2023 [employer] Vaginal Hysterectomy, Care After The following information offers guidance on how to care  for yourself after your procedure. Your health care provider may also give you more specific instructions. If you have problems or questions, contact your health care provider. What can I expect after the procedure? After the procedure, it is common to have: Pain in the lower abdomen and vagina. Vaginal bleeding and discharge for up to 1 week. You will need to use a sanitary pad after this procedure. Difficulty having a bowel movement (constipation). Temporary problems emptying the bladder. Tiredness (fatigue). Poor appetite. Less interest in sex. Feelings of sadness or other emotions. If your ovaries were also removed, it is also common to have symptoms of menopause, such as hot flashes, night sweats, and lack of sleep (insomnia). Follow these instructions at home: Medicines  Take over-the-counter and prescription medicines only as told by your health care provider. Ask your health care provider if the medicine prescribed to you: Requires you to avoid driving or using machinery. Can cause constipation. You may need to take these actions to prevent or treat constipation: Drink enough fluid to keep your urine pale yellow. Take over-the-counter or prescription medicines. Eat foods that are high in fiber, such as beans, whole grains, and fresh fruits and vegetables. Limit foods that are high in fat and processed sugars, such as fried or sweet foods. Activity  Rest as told by your health care provider. Return to your normal activities as told by your health care provider. Ask  your health care provider what activities are safe for you Avoid sitting for a long time without moving. Get up to take short walks every 1-2 hours. This is important to improve blood flow and breathing. Ask for help if you feel weak or unsteady. Try to have someone home with you for 1-2 weeks to help you with everyday chores. Do not lift anything that is heavier than 10 lb (4.5 kg), or the limit that you are told,  until your health care provider says that it is safe. If you were given a sedative during the procedure, it can affect you for several hours. Do not drive or operate machinery until your health care provider says that it is safe. Lifestyle Do not use any products that contain nicotine or tobacco. These products include cigarettes, chewing tobacco, and vaping devices, such as e-cigarettes. These can delay healing after surgery. If you need help quitting, ask your health care provider. Do not drink alcohol until your health care provider approves. General instructions Do not douche, use tampons, or have sex for at least 6 weeks, or as told by your health care provider. If you struggle with physical or emotional changes after your procedure, speak with your health care provider or a therapist. The stitches inside your vagina will dissolve over time and do not need to be taken out. Do not take baths, swim, or use a hot tub until your health care provider approves. You may only be allowed to take showers for 2-3 weeks Wear compression stockings as told by your health care provider. These stockings help to prevent blood clots and reduce swelling in your legs. Keep all follow-up visits. This is important. Contact a health care provider if: Your pain medicine is not helping. You have a fever. You have nausea or vomiting that does not go away. You feel dizzy. You have blood, pus, or a bad-smelling discharge from your vagina more than 1 week after the procedure. You continue to have trouble urinating 3-5 days after the procedure. Get help right away if: You have severe pain in your abdomen or back. You faint. You have heavy vaginal bleeding and blood clots, soaking through a sanitary pad in less than 1 hour. You have chest pain or shortness of breath. You have pain, swelling, or redness in your leg. These symptoms may represent a serious problem that is an emergency. Do not wait to see if the symptoms  will go away. Get medical help right away. Call your local emergency services (911 in the [place] Do not drive yourself to the hospital. Summary After the procedure, it is common to have pain, vaginal bleeding, constipation, temporary problems emptying your bladder, and feelings of sadness or other emotions. Take over-the-counter and prescription medicines only as told by your health care provider. Rest as told by your health care provider. Return to your normal activities as told by your health care provider. Contact a health care provider if your pain medicine is not helping, or you have a fever, dizziness, or trouble urinating several days after the procedure. Get help right away if you have severe pain in your abdomen or back, or if you faint, have heavy bleeding, or have chest pain or shortness of breath. This information is not intended to replace advice given to you by your health care provider. Make sure you discuss any questions you have with your health care provider. Document Revised: 09/19/2021 Document Reviewed: 03/10/2020 Elsevier Patient Education  2023 [employer].

## 2022-11-20 NOTE — Progress Notes (Signed)
Patient states that she has been bleeding for approx one week. Patient states that she goes about every 2- 2.5 weeks and then has a week of bleeding. Follow up appointment for ultrasound and EMB results. [name] RN

## 2022-11-20 NOTE — Progress Notes (Signed)
History:  47 y.o. [id number] here today for f/u if AUB and to review results or Endo bx and plan of care. She is s/p hysteroscopy with endometrial ablation 07/27/2019. She cont to have heavy menses assoc with pain. She has no pain outside of her menses.   The following portions of the patient's history were reviewed and updated as appropriate: allergies, current medications, past family history, past medical history, past social history, past surgical history and problem list.  Review of Systems:  Pertinent items are noted in HPI.    Objective:  Physical Exam Blood pressure 132/80, pulse 90, height 5' 8.5" (1.74 m), weight 220 lb (99.8 kg), last menstrual period 11/13/2022.  CONSTITUTIONAL: Well-developed, well-nourished female in no acute distress.  HENT:  Normocephalic, atraumatic EYES: Conjunctivae and EOM are normal. No scleral icterus.  NECK: Normal range of motion SKIN: Skin is warm and dry. No rash noted. Not diaphoretic.No pallor. NEUROLGIC: Alert and oriented to person, place, and time. Normal coordination.  Pelvic: Not repeated  Labs and Imaging 08/16/2022 CLINICAL DATA:  Enlarged uterus.   EXAM: TRANSABDOMINAL AND TRANSVAGINAL ULTRASOUND OF PELVIS   TECHNIQUE: Both transabdominal and transvaginal ultrasound examinations of the pelvis were performed. Transabdominal technique was performed for global imaging of the pelvis including uterus, ovaries, adnexal regions, and pelvic cul-de-sac. It was necessary to proceed with endovaginal exam following the transabdominal exam to visualize the endometrium.   COMPARISON:  Pelvic ultrasound 06/21/2019   FINDINGS: Uterus   Measurements: 10.6 x 6.1 x 6.7 cm = volume: 214.5 mL. No fibroids or other mass visualized. Heterogeneity of the myometrium.   Endometrium   Thickness: 19 mm.  No focal abnormality visualized.   Right ovary   Measurements: 3.5 x 2.1 x 2.5 cm = volume: 9.5 mL. Normal appearance/no adnexal mass.   Left  ovary   Measurements: 3.6 x 2.5 x 3.1 cm = volume: 14.5 mL. There is a 1.5 x 1.3 x 1.8 cm simple cyst within the left ovary.   Other findings   No abnormal free fluid.   IMPRESSION: Endometrium measures 19 mm. Endometrial thickness is considered abnormal. Consider follow-up by [place] in 6-8 weeks, during the week immediately following menses (exam timing is critical).   1.8 cm left ovarian follicle, normal finding for premenopausal patient (benign simple cyst for postmenopausal patient). No follow-up imaging is recommended.   Reference: Radiology 2019 Nov;293(2):359-371   Heterogeneity of the myometrium raising the possibility of adenomyosis.  10/23/2022 FINAL MICROSCOPIC DIAGNOSIS:   A. ENDOMETRIUM, BIOPSY:  Inactive endometrium with extensive breakdown and degenerative changes.  Small fragment with vascular changes suggestive of polyp.  Negative for atypia or malignancy.    GROSS DESCRIPTION:   The specimen is received in formalin and consists of a 1.7 x 1.5 x 0.2  cm aggregate of red-brown soft tissue.  The specimen is entirely  submitted in 1 cassette.  Lovey Newcomer 10/24/2022)   Assessment & Plan:  AUB suspect adenomyosis  Patient desires surgical management with total vaginal hysterectomy with bilateral salpingectomy. The risks of surgery were discussed in detail with the patient including but not limited to: bleeding which may require transfusion or reoperation; infection which may require prolonged hospitalization or re-hospitalization and antibiotic therapy; injury to bowel, bladder, ureters and major vessels or other surrounding organs; need for additional procedures including laparotomy; thromboembolic phenomenon, incisional problems and other postoperative or anesthesia complications.  Patient was told that the likelihood that her condition and symptoms will be treated effectively with this surgical management was  very high; the postoperative expectations were also discussed in  detail. The patient also understands the alternative treatment options which were discussed in full. All questions were answered.    She was notified that I am no longer preforming these cases and have referred her to Dr. Nettie Elm. He has been notified and the case was posted. She will be contacted by our surgical scheduler regarding the time and date of her surgery; routine preoperative instructions of having nothing to eat or drink after midnight on the day prior to surgery and also coming to the hospital.  She was told she will have a  preoperative appointment with him prior to surgery. Printed patient education handouts about the procedure were given to the patient to review at home.    Total face-to-face time with patient, review of chart, discussion with consultant and coordination of care was .    Herald Vallin L. Harraway-Smith, M.D., [facility]

## 2022-11-21 ENCOUNTER — Telehealth: Payer: Self-pay

## 2022-11-21 NOTE — Telephone Encounter (Signed)
Called patient to inform her that she will need to sign a hysterectomy statement. Patient states she might be able to come in to sign it today.  Orissa Arreaga l Niesha Bame, CMA

## 2022-11-21 NOTE — Telephone Encounter (Signed)
-----   Message from Melissa Aguirre sent at 11/21/2022 11:39 AM EDT ----- Regarding: NEEDS HYSTERECTOMY STATEMENT - SURGERY 6/25

## 2022-12-07 IMAGING — MG MM DIGITAL SCREENING BILAT W/ TOMO AND CAD
8 series · 8 of 24 positions shown · non-contrast
Comparison: Previous exam(s).

CLINICAL DATA: Screening.

EXAM:
DIGITAL SCREENING BILATERAL MAMMOGRAM WITH TOMOSYNTHESIS AND CAD
TECHNIQUE: Bilateral screening digital craniocaudal and mediolateral oblique
mammograms were obtained. Bilateral screening digital breast
tomosynthesis was performed. The images were evaluated with
computer-aided detection.

[R CC synth-2D]
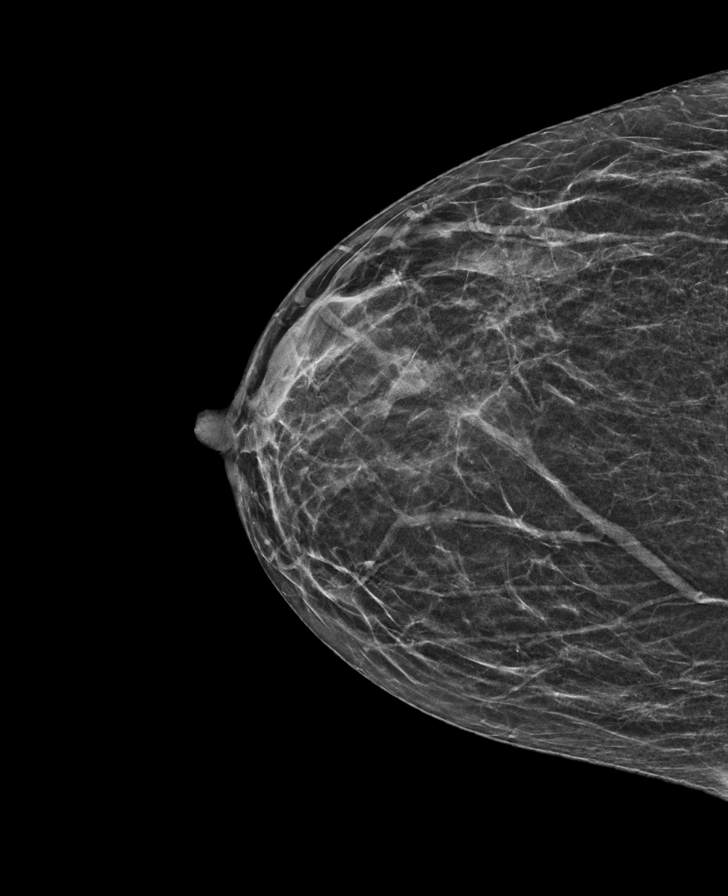

[L MLO synth-2D]
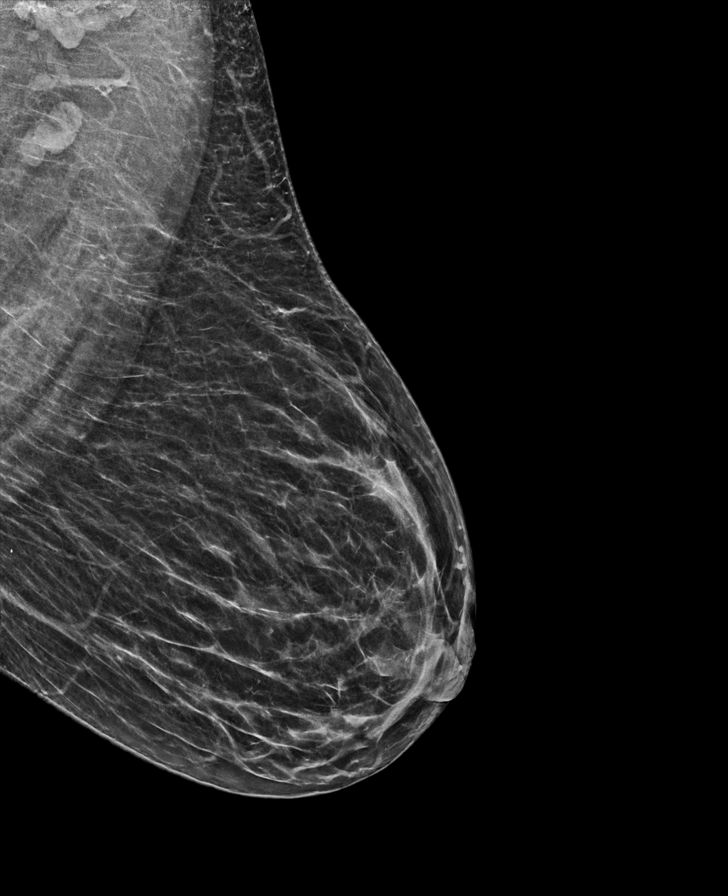

[R MLO synth-2D]
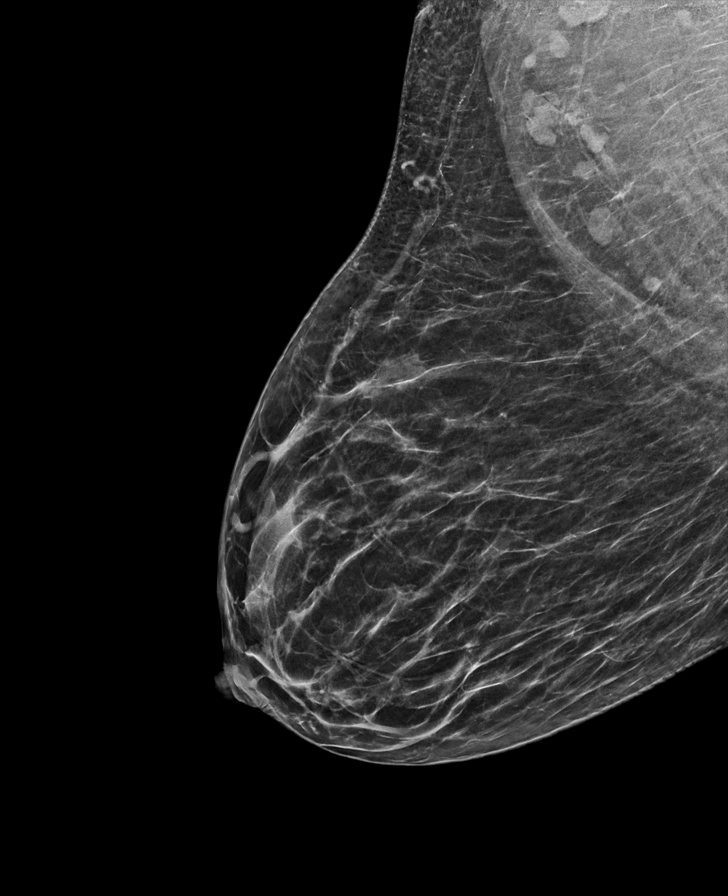

[L CC synth-2D]
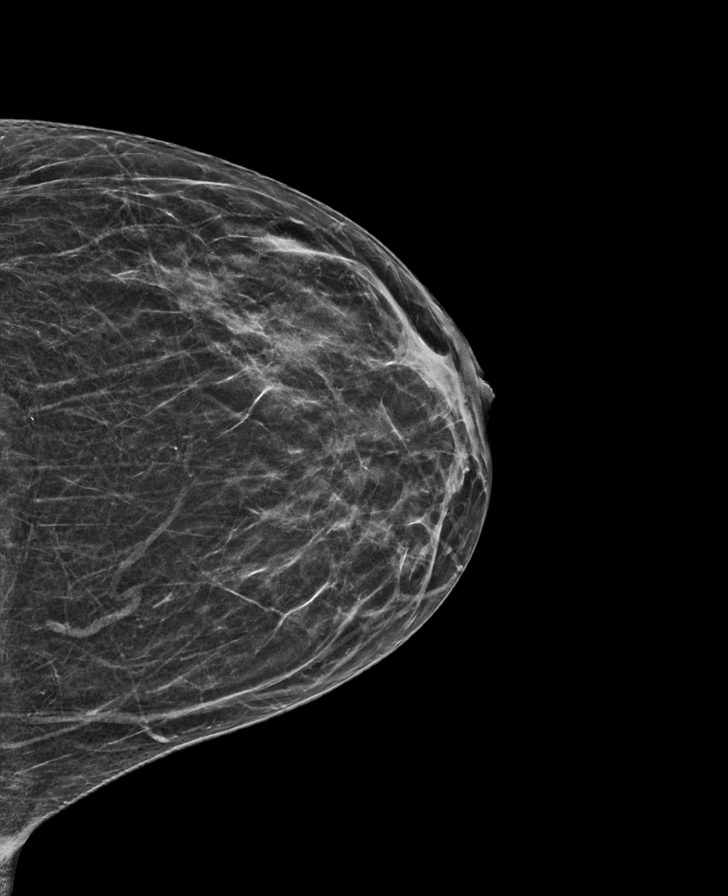

[R MLO tomo · tomo slice 29/56.0]
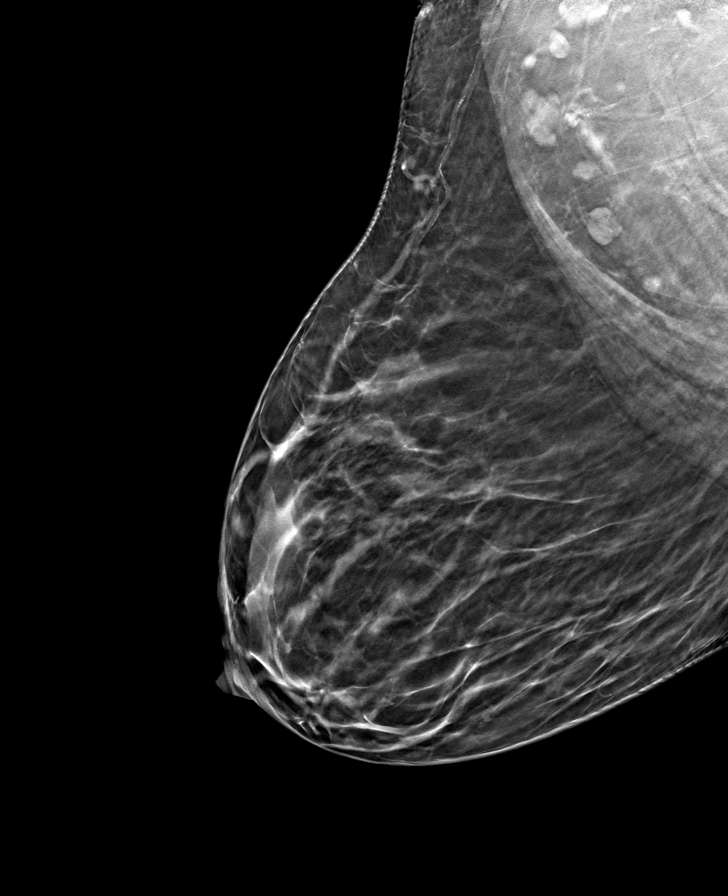

[R CC tomo · tomo slice 22/43.0]
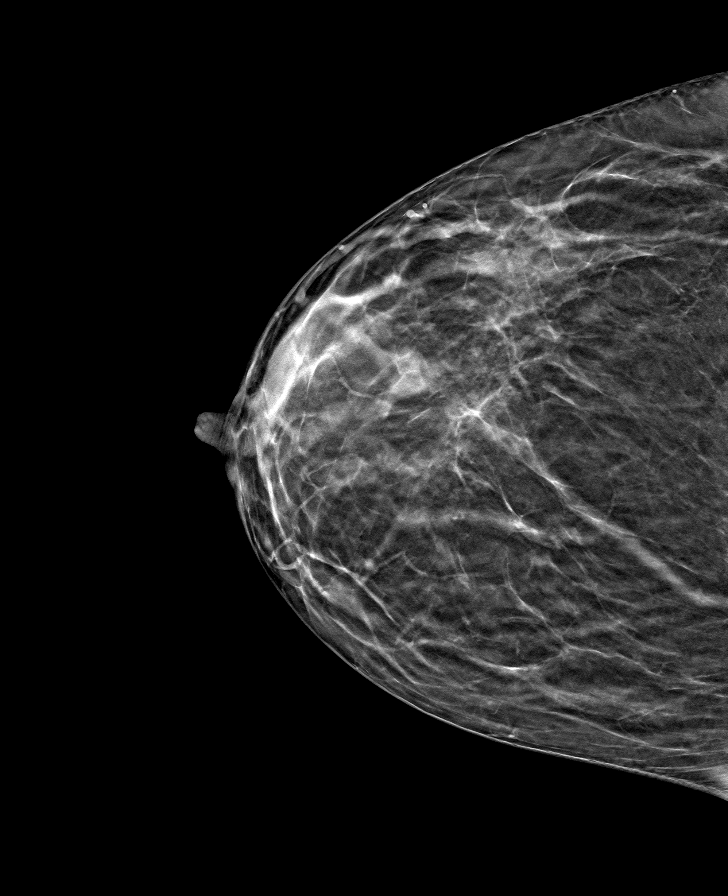

[L CC tomo · tomo slice 21/42.0]
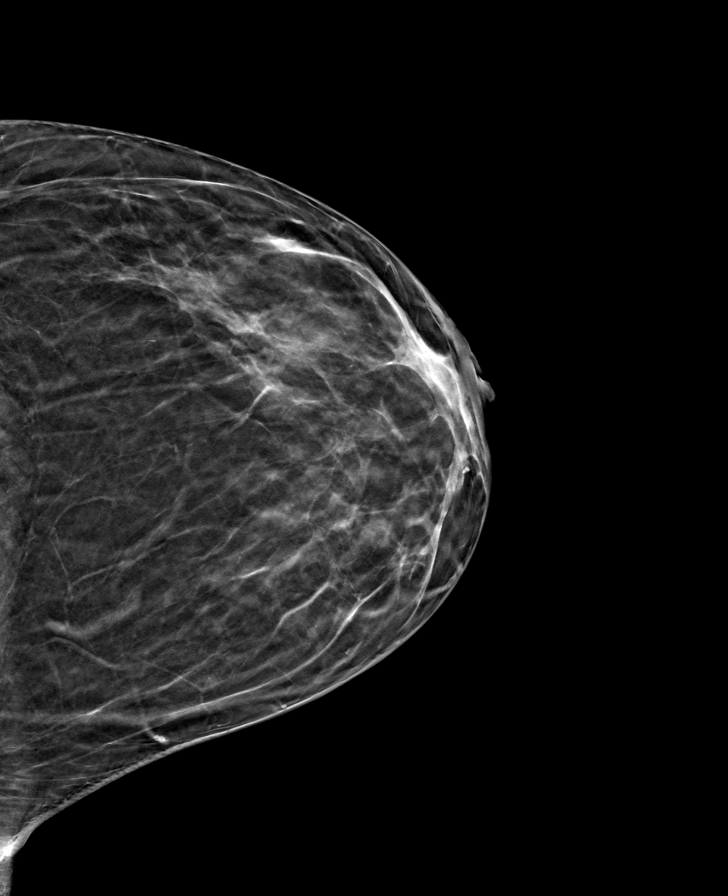

[L MLO tomo · tomo slice 27/54.0]
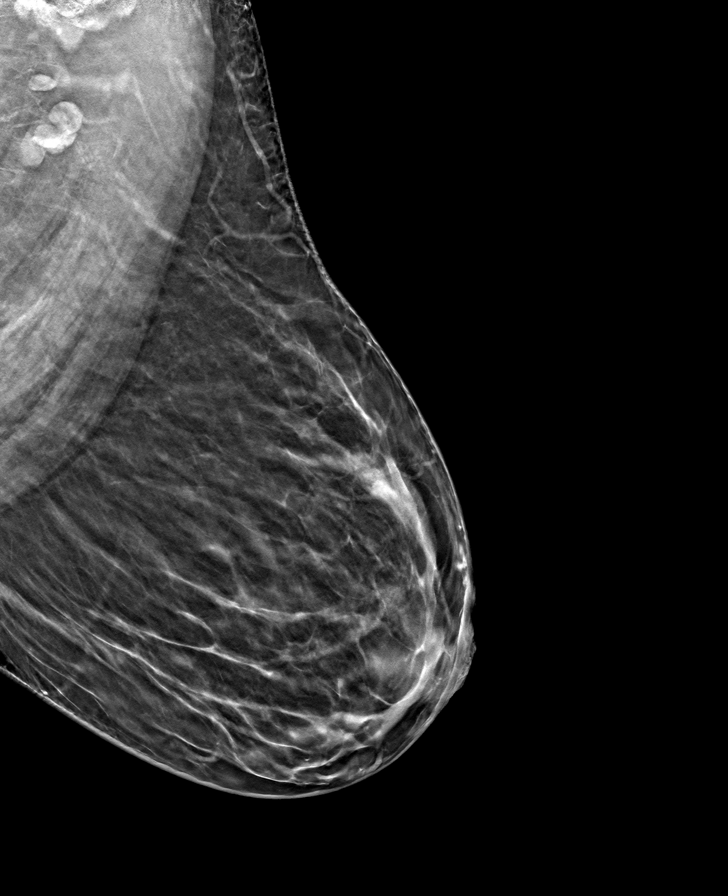

[8 of 24 positions shown; findings below may reference images not displayed]

ACR Breast Density Category b: There are scattered areas of
fibroglandular density.
FINDINGS: There are no findings suspicious for malignancy.
IMPRESSION: No mammographic evidence of malignancy. A result letter of this
screening mammogram will be mailed directly to the patient.

RECOMMENDATION:
Screening mammogram in one year. (Code:51-O-LD2)

BI-RADS CATEGORY  1: Negative.

## 2022-12-10 ENCOUNTER — Encounter: Payer: Self-pay | Admitting: Obstetrics and Gynecology

## 2022-12-10 ENCOUNTER — Ambulatory Visit (INDEPENDENT_AMBULATORY_CARE_PROVIDER_SITE_OTHER): Payer: Medicaid Other | Admitting: Obstetrics and Gynecology

## 2022-12-10 VITALS — BP 143/99 | HR 91 | Wt 227.0 lb

## 2022-12-10 DIAGNOSIS — N939 Abnormal uterine and vaginal bleeding, unspecified: Secondary | ICD-10-CM | POA: Diagnosis not present

## 2022-12-10 NOTE — Progress Notes (Signed)
Ms Amrhein presents in referral from Dr Fairview Lions for discussion of vag hyst Pt with [facility] for the last several months now. She has had a endometrial ablation in the past. S/p BTL. GYN U/S normal, 215 gms. EMBX normal  Pt desires definite therapy.  [facility] are interfering with her work and ADL's.  H/O HTN, controlled  SAB x 1 TSVD x 4 ( 8 # 10 oz largest) H/O BTL  Pap smear UTD  PE AF VSS Chaperone present  Lungs clear Heart RRR Abd soft + BS GU NL EGBUS, cervix no lesions, uterus < 10 weeks, mobile, slightly tender, no adnexal masses  A/P [facility]   Pt desires definite therapy. Vag hyst with possible salpingectomy reviewed with pt. R/B/Post op care reviewed. Hysterectomy already completed.  Any information on surgery and post op care provided. Pt will follow up with post op appt

## 2023-01-06 NOTE — H&P (Signed)
Melissa Aguirre is an 47 y.o. female G4P4 with c/o [facility]. Pt has tried endometrial endometrial ablation but continues to have problems with [facility]. EMBX normal. GYN U/S normal, 215 gms. H/O BTL. TSVD x 4 ( largest 8# 10 oz) Pt desires definite therapy.    Menstrual History: Menarche age: 63 No LMP recorded.    Past Medical History:  Diagnosis Date   Abnormal uterine bleeding    GERD (gastroesophageal reflux disease)    Hx of LEEP (loop electrosurgical excision procedure) of cervix complicating pregnancy    Pt is not pregnant    Hypertension    IBS (irritable bowel syndrome)    Pre-diabetes    Scoliosis     Past Surgical History:  Procedure Laterality Date   CHOLECYSTECTOMY     DILITATION & CURRETTAGE/HYSTROSCOPY WITH NOVASURE ABLATION N/A 07/27/2019   Procedure: DILATATION & CURETTAGE/HYSTEROSCOPY WITH NOVASURE ABLATION - POSSIBLE POLYPECTOMY;  Surgeon: Willodean Rosenthal, MD;  Location: [facility];  Service: Gynecology;  Laterality: N/A;   LEEP     TUBAL LIGATION      No family history on file.  Social History:  reports that she quit smoking about 7 years ago. Her smoking use included cigarettes. She has a 7.50 pack-year smoking history. She has never used smokeless tobacco. She reports current alcohol use. She reports that she does not use drugs.  Allergies:  Allergies  Allergen Reactions   Shellfish Allergy     Headache, nausea, see spots   Sulfa Antibiotics     Unknown childhood allergy    No medications prior to admission.    Review of Systems  Constitutional: Negative.   Respiratory: Negative.    Cardiovascular: Negative.   Genitourinary:  Positive for menstrual problem.    There were no vitals taken for this visit. Physical Exam Constitutional:      Appearance: Normal appearance.  Cardiovascular:     Rate and Rhythm: Normal rate and regular rhythm.  Pulmonary:     Effort: Pulmonary effort is normal.     Breath sounds: Normal breath  sounds.  Abdominal:     General: Bowel sounds are normal.     Palpations: Abdomen is soft.  Genitourinary:    Comments: Nl EGBUS, uterus small, mobile, < 10 weeks, no masses    No results found for this or any previous visit (from the past 24 hour(s)).  No results found.  Assessment/Plan: [facility]  Pt desires definite therapy. TVH with possible salpingectomy reviewed with pt. R/B/Post op care reviewed. Pt desires to proceed.   [name] 01/06/2023, 1:52 PM

## 2023-01-06 NOTE — Progress Notes (Signed)
Surgical Instructions    Your procedure is scheduled on Tuesday June 25.  Report to [facility] Main Entrance "A" at 10:15 A.M., then check in with the Admitting office.  Call this number if you have problems the morning of surgery:  [phone]   If you have any questions prior to your surgery date call [phone]: Open Monday-Friday 8am-4pm If you experience any cold or flu symptoms such as cough, fever, chills, shortness of breath, etc. between now and your scheduled surgery, please notify us at the above number     Remember:  Do not eat after midnight the night before your surgery  You may drink clear liquids until 9:15am the morning of your surgery.   Clear liquids allowed are: Water, Non-Citrus Juices (without pulp), Carbonated Beverages, Clear Tea, Black Coffee ONLY (NO MILK, CREAM OR POWDERED CREAMER of any kind), and Gatorade    Take these medicines the morning of surgery with A SIP OF WATER:  amLODipine (NORVASC)  omeprazole (PRILOSEC)   IF NEEDED TAKE: cyclobenzaprine (FLEXERIL)  famotidine (PEPCID)   As of today, STOP taking any Aspirin (unless otherwise instructed by your surgeon) Aleve, Naproxen, Ibuprofen, Motrin, Advil, Goody's, BC's,meloxicam (MOBIC), all herbal medications, fish oil, and all vitamins.           Do not wear jewelry or makeup. Do not wear lotions, powders, perfumes/cologne or deodorant. Do not shave 48 hours prior to surgery.  Men may shave face and neck. Do not bring valuables to the hospital. Do not wear nail polish, gel polish, artificial nails, or any other type of covering on natural nails (fingers and toes) If you have artificial nails or gel coating that need to be removed by a nail salon, please have this removed prior to surgery. Artificial nails or gel coating may interfere with anesthesia's ability to adequately monitor your vital signs.  [facility] is not responsible for any belongings or valuables.    Do NOT Smoke (Tobacco/Vaping)   24 hours prior to your procedure  If you use a CPAP at night, you may bring your mask for your overnight stay.   Contacts, glasses, hearing aids, dentures or partials may not be worn into surgery, please bring cases for these belongings   For patients admitted to the hospital, discharge time will be determined by your treatment team.   Patients discharged the day of surgery will not be allowed to drive home, and someone needs to stay with them for 24 hours.   SURGICAL WAITING ROOM VISITATION Patients having surgery or a procedure may have no more than 2 support people in the waiting area - these visitors may rotate.   Children under the age of 78 must have an adult with them who is not the patient. If the patient needs to stay at the hospital during part of their recovery, the visitor guidelines for inpatient rooms apply. Pre-op nurse will coordinate an appropriate time for 1 support person to accompany patient in pre-op.  This support person may not rotate.   Please refer to [email or web address] for the visitor guidelines for Inpatients (after your surgery is over and you are in a regular room).    Special instructions:    Oral Hygiene is also important to reduce your risk of infection.  Remember - BRUSH YOUR TEETH THE MORNING OF SURGERY WITH YOUR REGULAR TOOTHPASTE   [facility]- Preparing For Surgery  Before surgery, you can play an important role. Because skin is not sterile, your skin needs to be  as free of germs as possible. You can reduce the number of germs on your skin by washing with CHG (chlorahexidine gluconate) Soap before surgery.  CHG is an antiseptic cleaner which kills germs and bonds with the skin to continue killing germs even after washing.     Please do not use if you have an allergy to CHG or antibacterial soaps. If your skin becomes reddened/irritated stop using the CHG.  Do not shave (including legs and  underarms) for at least 48 hours prior to first CHG shower. It is OK to shave your face.  Please follow these instructions carefully.     Shower the NIGHT BEFORE SURGERY and the MORNING OF SURGERY with CHG Soap.   If you chose to wash your hair, wash your hair first as usual with your normal shampoo. After you shampoo, rinse your hair and body thoroughly to remove the shampoo.  Then [employer] and genitals (private parts) with your normal soap and rinse thoroughly to remove soap.  After that Use CHG Soap as you would any other liquid soap. You can apply CHG directly to the skin and wash gently with a scrungie or a clean washcloth.   Apply the CHG Soap to your body ONLY FROM THE NECK DOWN.  Do not use on open wounds or open sores. Avoid contact with your eyes, ears, mouth and genitals (private parts). Wash Face and genitals (private parts)  with your normal soap.   Wash thoroughly, paying special attention to the area where your surgery will be performed.  Thoroughly rinse your body with warm water from the neck down.  DO NOT shower/wash with your normal soap after using and rinsing off the CHG Soap.  Pat yourself dry with a CLEAN TOWEL.  Wear CLEAN PAJAMAS to bed the night before surgery  Place CLEAN SHEETS on your bed the night before your surgery  DO NOT SLEEP WITH PETS.   Day of Surgery:  Take a shower with CHG soap. Wear Clean/Comfortable clothing the morning of surgery Do not apply any deodorants/lotions.   Remember to brush your teeth WITH YOUR REGULAR TOOTHPASTE.    If you received a COVID test during your pre-op visit, it is requested that you wear a mask when out in public, stay away from anyone that may not be feeling well, and notify your surgeon if you develop symptoms. If you have been in contact with anyone that has tested positive in the last 10 days, please notify your surgeon.    Please read over the following fact sheets that you were given.

## 2023-01-07 ENCOUNTER — Encounter (HOSPITAL_COMMUNITY): Payer: Self-pay

## 2023-01-07 ENCOUNTER — Encounter (HOSPITAL_COMMUNITY)
Admission: RE | Admit: 2023-01-07 | Discharge: 2023-01-07 | Disposition: A | Discharge status: Home / Self Care | Payer: Medicaid Other | Source: Ambulatory Visit | Attending: Obstetrics and Gynecology | Admitting: Obstetrics and Gynecology

## 2023-01-07 ENCOUNTER — Other Ambulatory Visit: Payer: Self-pay

## 2023-01-07 VITALS — BP 142/87 | HR 78 | Temp 98.4°F | Resp 18 | Ht 68.0 in | Wt 221.9 lb

## 2023-01-07 DIAGNOSIS — Z01818 Encounter for other preprocedural examination: Secondary | ICD-10-CM | POA: Insufficient documentation

## 2023-01-07 DIAGNOSIS — N939 Abnormal uterine and vaginal bleeding, unspecified: Secondary | ICD-10-CM | POA: Diagnosis not present

## 2023-01-07 LAB — BASIC METABOLIC PANEL
Anion gap: 9 (ref 5–15)
BUN: 9 mg/dL (ref 6–20)
CO2: 27 mmol/L (ref 22–32)
Calcium: 8.9 mg/dL (ref 8.9–10.3)
Chloride: 100 mmol/L (ref 98–111)
Creatinine, Ser: 0.91 mg/dL (ref 0.44–1.00)
GFR, Estimated: >60 mL/min (ref >60)
Glucose, Bld: 118 mg/dL — ABNORMAL HIGH (ref 70–99)
Potassium: 3.8 mmol/L (ref 3.5–5.1)
Sodium: 136 mmol/L (ref 135–145)

## 2023-01-07 LAB — TYPE AND SCREEN
ABO/RH(D): A NEG
Antibody Screen: NEGATIVE

## 2023-01-07 LAB — CBC
HCT: 34.7 % — ABNORMAL LOW (ref 36.0–46.0)
Hemoglobin: 11.1 g/dL — ABNORMAL LOW (ref 12.0–15.0)
MCH: 24.7 pg — ABNORMAL LOW (ref 26.0–34.0)
MCHC: 32 g/dL (ref 30.0–36.0)
MCV: 77.3 fL — ABNORMAL LOW (ref 80.0–100.0)
Platelets: 198 10*3/uL (ref 150–400)
RBC: 4.49 MIL/uL (ref 3.87–5.11)
RDW: 15 % (ref 11.5–15.5)
WBC: 6.4 10*3/uL (ref 4.0–10.5)
nRBC: 0 % (ref 0.0–0.2)

## 2023-01-07 NOTE — Progress Notes (Signed)
PCP - Delbert Harness, MD Cardiologist - denies  PPM/ICD - denies Device Orders -  Rep Notified -   Chest x-ray - 08/05/17 EKG - 01/07/23 Stress Test - none ECHO - none Cardiac Cath - none  Sleep Study - 2016 CPAP - was recommended but pt lost insurance and did not get one .   Fasting Blood Sugar - pre diabetic;does not check.  Checks Blood Sugar _____ times a day  Last dose of GLP1 agonist-  na GLP1 instructions: na  Blood Thinner Instructions:na Aspirin Instructions:na  ERAS Protcol -clears until 0915 PRE-SURGERY Ensure or G2- no  COVID TEST- na   Anesthesia review: no  Patient denies shortness of breath, fever, cough and chest pain at PAT appointment   All instructions explained to the patient, with a verbal understanding of the material. Patient agrees to go over the instructions while at home for a better understanding. Patient also instructed to wear a mask when out in public prior to surgery. The opportunity to ask questions was provided.

## 2023-01-14 ENCOUNTER — Observation Stay (HOSPITAL_COMMUNITY)
Admission: RE | Admit: 2023-01-14 | Discharge: 2023-01-15 | Disposition: A | Discharge status: Home / Self Care | Payer: Medicaid Other | Attending: Obstetrics and Gynecology | Admitting: Obstetrics and Gynecology

## 2023-01-14 ENCOUNTER — Observation Stay (HOSPITAL_COMMUNITY): Payer: Medicaid Other

## 2023-01-14 ENCOUNTER — Other Ambulatory Visit: Payer: Self-pay

## 2023-01-14 ENCOUNTER — Encounter (HOSPITAL_COMMUNITY): Payer: Self-pay | Admitting: Obstetrics and Gynecology

## 2023-01-14 ENCOUNTER — Encounter (HOSPITAL_COMMUNITY)
Admission: RE | Disposition: A | Discharge status: Home / Self Care | Payer: Self-pay | Source: Home / Self Care | Attending: Obstetrics and Gynecology

## 2023-01-14 ENCOUNTER — Observation Stay (HOSPITAL_BASED_OUTPATIENT_CLINIC_OR_DEPARTMENT_OTHER): Payer: Medicaid Other

## 2023-01-14 DIAGNOSIS — D259 Leiomyoma of uterus, unspecified: Secondary | ICD-10-CM | POA: Diagnosis present

## 2023-01-14 DIAGNOSIS — I10 Essential (primary) hypertension: Secondary | ICD-10-CM

## 2023-01-14 DIAGNOSIS — N920 Excessive and frequent menstruation with regular cycle: Secondary | ICD-10-CM | POA: Insufficient documentation

## 2023-01-14 DIAGNOSIS — N939 Abnormal uterine and vaginal bleeding, unspecified: Secondary | ICD-10-CM | POA: Insufficient documentation

## 2023-01-14 DIAGNOSIS — N8003 Adenomyosis of the uterus: Secondary | ICD-10-CM

## 2023-01-14 DIAGNOSIS — Z87891 Personal history of nicotine dependence: Secondary | ICD-10-CM | POA: Insufficient documentation

## 2023-01-14 DIAGNOSIS — Z9889 Other specified postprocedural states: Secondary | ICD-10-CM

## 2023-01-14 DIAGNOSIS — Z01818 Encounter for other preprocedural examination: Secondary | ICD-10-CM

## 2023-01-14 HISTORY — PX: VAGINAL HYSTERECTOMY: SHX2639

## 2023-01-14 LAB — POCT PREGNANCY, URINE: Preg Test, Ur: NEGATIVE

## 2023-01-14 SURGERY — HYSTERECTOMY, VAGINAL
Anesthesia: General | Site: Vagina | Laterality: Bilateral

## 2023-01-14 MED ORDER — SUGAMMADEX SODIUM 200 MG/2ML IV SOLN
INTRAVENOUS | Status: DC | PRN
  Administered 2023-01-14: 200 mg via INTRAVENOUS

## 2023-01-14 MED ORDER — LOSARTAN POTASSIUM 50 MG PO TABS
50.0000 mg | ORAL_TABLET | Freq: Every day | ORAL | Status: DC
  Administered 2023-01-15: 50 mg via ORAL
  Filled 2023-01-14: qty 1

## 2023-01-14 MED ORDER — ROCURONIUM BROMIDE 10 MG/ML (PF) SYRINGE
PREFILLED_SYRINGE | INTRAVENOUS | Status: DC | PRN
  Administered 2023-01-14: 80 mg via INTRAVENOUS

## 2023-01-14 MED ORDER — CHLORHEXIDINE GLUCONATE 0.12 % MT SOLN
15.0000 mL | Freq: Once | OROMUCOSAL | Status: AC
  Administered 2023-01-14: 15 mL via OROMUCOSAL
  Filled 2023-01-14: qty 15

## 2023-01-14 MED ORDER — FENTANYL CITRATE (PF) 100 MCG/2ML IJ SOLN
25.0000 ug | INTRAMUSCULAR | Status: DC | PRN
  Administered 2023-01-14 (×4): 50 ug via INTRAVENOUS

## 2023-01-14 MED ORDER — SOD CITRATE-CITRIC ACID 500-334 MG/5ML PO SOLN
30.0000 mL | ORAL | Status: AC
  Administered 2023-01-14: 30 mL via ORAL
  Filled 2023-01-14: qty 30

## 2023-01-14 MED ORDER — POVIDONE-IODINE 10 % EX SWAB: 2.0000 | Freq: Once | CUTANEOUS | Status: DC

## 2023-01-14 MED ORDER — HYDROMORPHONE HCL 1 MG/ML IJ SOLN
INTRAMUSCULAR | Status: DC | PRN
  Administered 2023-01-14: .5 mg via INTRAVENOUS

## 2023-01-14 MED ORDER — PROPOFOL 10 MG/ML IV BOLUS
INTRAVENOUS | Status: AC
  Filled 2023-01-14: qty 20

## 2023-01-14 MED ORDER — MIDAZOLAM HCL 2 MG/2ML IJ SOLN
INTRAMUSCULAR | Status: DC | PRN
  Administered 2023-01-14: 2 mg via INTRAVENOUS

## 2023-01-14 MED ORDER — ORAL CARE MOUTH RINSE: 15.0000 mL | Freq: Once | OROMUCOSAL | Status: AC

## 2023-01-14 MED ORDER — LIDOCAINE 2% (20 MG/ML) 5 ML SYRINGE
INTRAMUSCULAR | Status: DC | PRN
  Administered 2023-01-14: 80 mg via INTRAVENOUS

## 2023-01-14 MED ORDER — MIDAZOLAM HCL 2 MG/2ML IJ SOLN
INTRAMUSCULAR | Status: AC
  Filled 2023-01-14: qty 2

## 2023-01-14 MED ORDER — LIDOCAINE 2% (20 MG/ML) 5 ML SYRINGE
INTRAMUSCULAR | Status: AC
  Filled 2023-01-14: qty 5

## 2023-01-14 MED ORDER — CEFAZOLIN SODIUM-DEXTROSE 2-4 GM/100ML-% IV SOLN
2.0000 g | INTRAVENOUS | Status: AC
  Administered 2023-01-14: 2 g via INTRAVENOUS
  Filled 2023-01-14: qty 100

## 2023-01-14 MED ORDER — ZOLPIDEM TARTRATE 5 MG PO TABS
5.0000 mg | ORAL_TABLET | Freq: Every evening | ORAL | Status: DC | PRN
  Administered 2023-01-14: 5 mg via ORAL
  Filled 2023-01-14: qty 1

## 2023-01-14 MED ORDER — DEXAMETHASONE SODIUM PHOSPHATE 10 MG/ML IJ SOLN
INTRAMUSCULAR | Status: DC | PRN
  Administered 2023-01-14: 10 mg via INTRAVENOUS

## 2023-01-14 MED ORDER — FENTANYL CITRATE (PF) 100 MCG/2ML IJ SOLN
INTRAMUSCULAR | Status: AC
  Filled 2023-01-14: qty 2

## 2023-01-14 MED ORDER — KETOROLAC TROMETHAMINE 30 MG/ML IJ SOLN
30.0000 mg | INTRAMUSCULAR | Status: AC
  Filled 2023-01-14: qty 1

## 2023-01-14 MED ORDER — ONDANSETRON HCL 4 MG PO TABS
4.0000 mg | ORAL_TABLET | Freq: Four times a day (QID) | ORAL | Status: DC | PRN
  Administered 2023-01-15: 4 mg via ORAL
  Filled 2023-01-14: qty 1

## 2023-01-14 MED ORDER — ONDANSETRON HCL 4 MG/2ML IJ SOLN
INTRAMUSCULAR | Status: DC | PRN
  Administered 2023-01-14: 4 mg via INTRAVENOUS

## 2023-01-14 MED ORDER — ACETAMINOPHEN 10 MG/ML IV SOLN: 1000.0000 mg | Freq: Once | INTRAVENOUS | Status: DC | PRN

## 2023-01-14 MED ORDER — METOPROLOL TARTRATE 5 MG/5ML IV SOLN
INTRAVENOUS | Status: AC
  Filled 2023-01-14: qty 5

## 2023-01-14 MED ORDER — HYDROMORPHONE HCL 1 MG/ML IJ SOLN
INTRAMUSCULAR | Status: AC
  Filled 2023-01-14: qty 0.5

## 2023-01-14 MED ORDER — LACTATED RINGERS IV SOLN: INTRAVENOUS | Status: DC

## 2023-01-14 MED ORDER — PROPOFOL 10 MG/ML IV BOLUS
INTRAVENOUS | Status: DC | PRN
  Administered 2023-01-14: 130 mg via INTRAVENOUS
  Administered 2023-01-14: 70 mg via INTRAVENOUS

## 2023-01-14 MED ORDER — ACETAMINOPHEN 500 MG PO TABS: 1000.0000 mg | ORAL_TABLET | Freq: Once | ORAL | Status: DC | PRN

## 2023-01-14 MED ORDER — AMLODIPINE BESYLATE 10 MG PO TABS
10.0000 mg | ORAL_TABLET | Freq: Every day | ORAL | Status: DC
  Administered 2023-01-15: 10 mg via ORAL
  Filled 2023-01-14: qty 1

## 2023-01-14 MED ORDER — KETOROLAC TROMETHAMINE 15 MG/ML IJ SOLN
INTRAMUSCULAR | Status: AC
  Filled 2023-01-14: qty 1

## 2023-01-14 MED ORDER — OXYCODONE HCL 5 MG/5ML PO SOLN: 5.0000 mg | Freq: Once | ORAL | Status: DC | PRN

## 2023-01-14 MED ORDER — SCOPOLAMINE 1 MG/3DAYS TD PT72
MEDICATED_PATCH | TRANSDERMAL | Status: DC | PRN
  Administered 2023-01-14: 1 via TRANSDERMAL

## 2023-01-14 MED ORDER — ACETAMINOPHEN 500 MG PO TABS
1000.0000 mg | ORAL_TABLET | ORAL | Status: AC
  Administered 2023-01-14: 1000 mg via ORAL
  Filled 2023-01-14: qty 2

## 2023-01-14 MED ORDER — SENNA 8.6 MG PO TABS
1.0000 | ORAL_TABLET | Freq: Two times a day (BID) | ORAL | Status: DC
  Administered 2023-01-14 – 2023-01-15 (×2): 8.6 mg via ORAL
  Filled 2023-01-14 (×2): qty 1

## 2023-01-14 MED ORDER — HYDROMORPHONE HCL 1 MG/ML IJ SOLN
1.0000 mg | INTRAMUSCULAR | Status: DC | PRN
  Administered 2023-01-14 (×2): 1 mg via INTRAVENOUS
  Filled 2023-01-14 (×2): qty 1

## 2023-01-14 MED ORDER — SIMETHICONE 80 MG PO CHEW
80.0000 mg | CHEWABLE_TABLET | Freq: Four times a day (QID) | ORAL | Status: DC | PRN
  Administered 2023-01-15: 80 mg via ORAL
  Filled 2023-01-14: qty 1

## 2023-01-14 MED ORDER — IBUPROFEN 800 MG PO TABS: 800.0000 mg | ORAL_TABLET | Freq: Three times a day (TID) | ORAL | Status: DC

## 2023-01-14 MED ORDER — OXYCODONE-ACETAMINOPHEN 5-325 MG PO TABS
1.0000 | ORAL_TABLET | ORAL | Status: DC | PRN
  Administered 2023-01-15: 1 via ORAL
  Filled 2023-01-14: qty 1

## 2023-01-14 MED ORDER — OXYCODONE-ACETAMINOPHEN 5-325 MG PO TABS
2.0000 | ORAL_TABLET | ORAL | Status: DC | PRN
  Administered 2023-01-14 – 2023-01-15 (×3): 2 via ORAL
  Filled 2023-01-14 (×3): qty 2

## 2023-01-14 MED ORDER — FENTANYL CITRATE (PF) 250 MCG/5ML IJ SOLN
INTRAMUSCULAR | Status: DC | PRN
  Administered 2023-01-14 (×5): 50 ug via INTRAVENOUS

## 2023-01-14 MED ORDER — KETOROLAC TROMETHAMINE 30 MG/ML IJ SOLN
30.0000 mg | Freq: Four times a day (QID) | INTRAMUSCULAR | Status: DC
  Administered 2023-01-14 – 2023-01-15 (×3): 30 mg via INTRAVENOUS
  Filled 2023-01-14 (×3): qty 1

## 2023-01-14 MED ORDER — METOPROLOL TARTRATE 5 MG/5ML IV SOLN
INTRAVENOUS | Status: DC | PRN
  Administered 2023-01-14: 3 mg via INTRAVENOUS

## 2023-01-14 MED ORDER — FENTANYL CITRATE (PF) 250 MCG/5ML IJ SOLN
INTRAMUSCULAR | Status: AC
  Filled 2023-01-14: qty 5

## 2023-01-14 MED ORDER — 0.9 % SODIUM CHLORIDE (POUR BTL) OPTIME
TOPICAL | Status: DC | PRN
  Administered 2023-01-14: 1000 mL

## 2023-01-14 MED ORDER — KETOROLAC TROMETHAMINE 15 MG/ML IJ SOLN
INTRAMUSCULAR | Status: AC
  Administered 2023-01-14: 30 mg
  Filled 2023-01-14: qty 1

## 2023-01-14 MED ORDER — ACETAMINOPHEN 160 MG/5ML PO SOLN: 1000.0000 mg | Freq: Once | ORAL | Status: DC | PRN

## 2023-01-14 MED ORDER — ONDANSETRON HCL 4 MG/2ML IJ SOLN
4.0000 mg | Freq: Four times a day (QID) | INTRAMUSCULAR | Status: DC | PRN
  Administered 2023-01-14: 4 mg via INTRAVENOUS
  Filled 2023-01-14: qty 2

## 2023-01-14 MED ORDER — OXYCODONE HCL 5 MG PO TABS: 5.0000 mg | ORAL_TABLET | Freq: Once | ORAL | Status: DC | PRN

## 2023-01-14 SURGICAL SUPPLY — 24 items
BAG COUNTER SPONGE SURGICOUNT (BAG) ×1 IMPLANT
BAG SPNG CNTER NS LX DISP (BAG) ×1
BLADE SURG 10 STRL SS (BLADE) ×1 IMPLANT
GAUZE 4X4 16PLY ~~LOC~~+RFID DBL (SPONGE) ×2 IMPLANT
GLOVE BIO SURGEON STRL SZ7.5 (GLOVE) ×1 IMPLANT
GLOVE SURG UNDER POLY LF SZ7 (GLOVE) ×1 IMPLANT
GOWN STRL REUS W/ TWL LRG LVL3 (GOWN DISPOSABLE) ×2 IMPLANT
GOWN STRL REUS W/ TWL XL LVL3 (GOWN DISPOSABLE) ×2 IMPLANT
GOWN STRL REUS W/TWL LRG LVL3 (GOWN DISPOSABLE) ×2
GOWN STRL REUS W/TWL XL LVL3 (GOWN DISPOSABLE) ×2
HIBICLENS CHG 4% 4OZ BTL (MISCELLANEOUS) ×1 IMPLANT
KIT TURNOVER KIT B (KITS) ×1 IMPLANT
MANIFOLD NEPTUNE II (INSTRUMENTS) ×1 IMPLANT
NS IRRIG 1000ML POUR BTL (IV SOLUTION) ×1 IMPLANT
PACK VAGINAL WOMENS (CUSTOM PROCEDURE TRAY) ×1 IMPLANT
PAD OB MATERNITY 4.3X12.25 (PERSONAL CARE ITEMS) ×1 IMPLANT
SUT VIC AB 2-0 CT1 18 (SUTURE) ×1 IMPLANT
SUT VIC AB 2-0 CT1 27 (SUTURE) ×1
SUT VIC AB 2-0 CT1 TAPERPNT 27 (SUTURE) ×1 IMPLANT
SUT VIC AB PLUS 45CM 1-MO-4 (SUTURE) ×2 IMPLANT
SUT VICRYL 1 TIES 12X18 (SUTURE) ×1 IMPLANT
TOWEL GREEN STERILE FF (TOWEL DISPOSABLE) ×1 IMPLANT
TRAY FOLEY W/BAG SLVR 14FR (SET/KITS/TRAYS/PACK) ×1 IMPLANT
UNDERPAD 30X36 HEAVY ABSORB (UNDERPADS AND DIAPERS) ×1 IMPLANT

## 2023-01-14 NOTE — Transfer of Care (Signed)
Immediate Anesthesia Transfer of Care Note  Patient: Melissa Aguirre  Procedure(s) Performed: VAGINAL HYSTERECTOMY AND LEFT SALPINGECTOMY (Bilateral: Vagina )  Patient Location: PACU  Anesthesia Type:General  Level of Consciousness: awake, alert , and oriented  Airway & Oxygen Therapy: Patient Spontanous Breathing and Patient connected to face mask oxygen  Post-op Assessment: Report given to RN and Post -op Vital signs reviewed and stable  Post vital signs: Reviewed and stable  Last Vitals:  Vitals Value Taken Time  BP    Temp    Pulse    Resp    SpO2      Last Pain:  Vitals:   01/14/23 1041  TempSrc:   PainSc: 0-No pain      Patients Stated Pain Goal: 0 (01/14/23 1041)  Complications: No notable events documented.

## 2023-01-14 NOTE — Op Note (Signed)
Melissa Aguirre PROCEDURE DATE: 01/14/2023  PREOPERATIVE DIAGNOSIS:  Symptomatic fibroids, menorrhagia POSTOPERATIVE DIAGNOSIS:  Symptomatic fibroids, menorrhagia SURGEON:   Nettie Elm M.D. Threasa HeadsGildardo Griffes, M.D.  An experienced assistant was required given the standard of surgical care given the complexity of the case.  This assistant was needed for exposure, dissection, suctioning, retraction, and for overall help during the procedure.   OPERATION:  Total Vaginal hysterectomy and left salpingectomy ANESTHESIA:  General endotracheal.  INDICATIONS: The patient is a 47 y.o. [id number] with history of symptomatic uterine fibroids/menorrhagia. The patient made a decision to undergo definite surgical treatment. On the preoperative visit, the risks, benefits, indications, and alternatives of the procedure were reviewed with the patient.  On the day of surgery, the risks of surgery were again discussed with the patient including but not limited to: bleeding which may require transfusion or reoperation; infection which may require antibiotics; injury to bowel, bladder, ureters or other surrounding organs; need for additional procedures; thromboembolic phenomenon, incisional problems and other postoperative/anesthesia complications. Written informed consent was obtained.    OPERATIVE FINDINGS: A 10 week size uterus with left tube, unable to see right tube and ovary and left ovary with a 3 cm simple cyst  ESTIMATED BLOOD LOSS: 240 ml FLUIDS:  As recorded URINE OUTPUT:  As recorded  SPECIMENS:  Uterus and cervix and portion of left tube sent to pathology COMPLICATIONS:  None immediate.  DESCRIPTION OF PROCEDURE:  The patient received intravenous antibiotics and had sequential compression devices applied to her lower extremities while in the preoperative area.  She was then taken to the operating room where general anesthesia was administered and was found to be adequate.  She was placed in the dorsal  lithotomy position, and was prepped and draped in a sterile manner.  A Foley catheter was inserted into her bladder and attached to constant drainage. After an adequate timeout was performed, attention was turned to her pelvis.  A weighted speculum was then placed in the vagina, and the posterior lip of the cervix was grasped  with tenaculum. The posterior cul de sac was entered sharply without problems. Long bill weight speculum was placed. The anterior lip of the cervix was then grasped with tenaculum, circumferentially incised, and the bladder was dissected off the pubocervical fascia anteriorly without complication.  The anterior cul-de-sac was then entered sharply without difficulty and a retractor was placed.  The same procedure was performed posteriorly and the posterior cul-de-sac was entered sharply without difficulty.   Zeplin clamps were then used to clamp the uterosacral ligaments on either side.  They were then cut and sutured ligated with 0 Vicryl. Of note, all sutures used in this case were 0 Vicryl unless otherwise noted.   The cardinal ligaments were then clamped, cut and ligated. The uterine vessels and broad ligaments were then serially clamped with the Heaney clamps, cut, and suture ligated on both sides.  Excellent hemostasis was noted at this point.  The uterus was then morcellated using a bivalve method. This allowed better exposure of the remaining pedicles. The final pedicle involving the uteroovarian was then clamped, cut and ligated using a free tie and in a [place] fashion. The uterus was delivered and sent to pathology.   After completion of the hysterectomy, all pedicles from the uterosacral ligament to the cornua were examined hemostasis was confirmed.  The left tube was grasped and clamped, cut and ligated with a free tie. The vaginal cuff was then closed with 2/0 Vicryl.  All instruments were then removed from the pelvis.  The patient tolerated the procedure well.  All instruments,  needles, and sponge counts were correct x 2. The patient was taken to the recovery room in stable condition.    Nettie Elm, MD, FACOG Attending Obstetrician & Gynecologist Faculty Practice, [facility]

## 2023-01-14 NOTE — Anesthesia Preprocedure Evaluation (Addendum)
Anesthesia Evaluation  Patient identified by MRN, date of birth, ID band Patient awake    Reviewed: Allergy & Precautions, NPO status , Patient's Chart, lab work & pertinent test results  History of Anesthesia Complications Negative for: history of anesthetic complications  Airway Mallampati: III  TM Distance: >3 FB Neck ROM: Full    Dental  (+) Dental Advisory Given   Pulmonary Patient abstained from smoking., former smoker   breath sounds clear to auscultation       Cardiovascular hypertension,  Rhythm:Regular     Neuro/Psych negative neurological ROS  negative psych ROS   GI/Hepatic Neg liver ROS,GERD  ,,  Endo/Other  negative endocrine ROS    Renal/GU negative Renal ROS     Musculoskeletal negative musculoskeletal ROS (+)    Abdominal   Peds  Hematology negative hematology ROS (+)   Anesthesia Other Findings   Reproductive/Obstetrics                             Anesthesia Physical Anesthesia Plan  ASA: 2  Anesthesia Plan: General   Post-op Pain Management: Toradol IV (intra-op)* and Ofirmev IV (intra-op)*   Induction: Intravenous  PONV Risk Score and Plan: 3 and Ondansetron and Dexamethasone  Airway Management Planned: Oral ETT and LMA  Additional Equipment: None  Intra-op Plan:   Post-operative Plan: Extubation in OR  Informed Consent: I have reviewed the patients History and Physical, chart, labs and discussed the procedure including the risks, benefits and alternatives for the proposed anesthesia with the patient or authorized representative who has indicated his/her understanding and acceptance.     Dental advisory given  Plan Discussed with: CRNA  Anesthesia Plan Comments:        Anesthesia Quick Evaluation

## 2023-01-14 NOTE — Interval H&P Note (Signed)
History and Physical Interval Note:  01/14/2023 12:06 PM  Melissa Aguirre  has presented today for surgery, with the diagnosis of AUB.  The various methods of treatment have been discussed with the patient and family. After consideration of risks, benefits and other options for treatment, the patient has consented to  Procedure(s): HYSTERECTOMY VAGINAL WITH POSSIBLE SALPINGECTOMY (Bilateral) as a surgical intervention.  The patient's history has been reviewed, patient examined, no change in status, stable for surgery.  I have reviewed the patient's chart and labs.  Questions were answered to the patient's satisfaction.     [name]

## 2023-01-14 NOTE — TOC Initial Note (Signed)
Transition of Care [facility]) - Initial/Assessment Note    Patient Details  Name: Melissa Aguirre MRN: [id number] Date of Birth: 1975/07/26  Transition of Care [facility]) CM/SW Contact:    Lawerance Sabal, RN Phone Number: 01/14/2023, 4:03 PM  Clinical Narrative:                  Independent patient from home admitted for Total Vaginal hysterectomy and left salpingectomy. No DC needs anticipated.  Expected Discharge Plan: Home/Self Care     Patient Goals and CMS Choice            Expected Discharge Plan and Services       Living arrangements for the past 2 months: Single Family Home                                      Prior Living Arrangements/Services Living arrangements for the past 2 months: Single Family Home                     Activities of Daily Living      Permission Sought/Granted                  Emotional Assessment              Admission diagnosis:  Post-operative state [Z98.890] Patient Active Problem List   Diagnosis Date Noted   Post-operative state 01/14/2023   Abnormal uterine bleeding (AUB) 07/14/2019   PCP:  Macy Mis, MD Pharmacy:   [facility] [address] [place], [place] - [address] [place] [address] Phone: [phone] Fax: [phone]     Social Determinants of Health (SDOH) Social History: SDOH Screenings   Tobacco Use: Medium Risk (01/14/2023)   SDOH Interventions:     Readmission Risk Interventions     No data to display

## 2023-01-14 NOTE — Anesthesia Procedure Notes (Signed)
Procedure Name: Intubation Date/Time: 01/14/2023 12:47 PM  Performed by: Stanton Kidney, CRNAPre-anesthesia Checklist: Patient identified, Patient being monitored, Timeout performed, Emergency Drugs available and Suction available Patient Re-evaluated:Patient Re-evaluated prior to induction Oxygen Delivery Method: Circle system utilized Preoxygenation: Pre-oxygenation with 100% oxygen Induction Type: IV induction Ventilation: Mask ventilation without difficulty Laryngoscope Size: Mac and 3 Grade View: Grade I Tube type: Oral Tube size: 7.0 mm Number of attempts: 1 Airway Equipment and Method: Stylet Placement Confirmation: ETT inserted through vocal cords under direct vision, positive ETCO2 and breath sounds checked- equal and bilateral Secured at: 22 cm Tube secured with: Tape Dental Injury: Teeth and Oropharynx as per pre-operative assessment

## 2023-01-15 ENCOUNTER — Encounter (HOSPITAL_COMMUNITY): Payer: Self-pay | Admitting: Obstetrics and Gynecology

## 2023-01-15 DIAGNOSIS — D259 Leiomyoma of uterus, unspecified: Secondary | ICD-10-CM | POA: Diagnosis not present

## 2023-01-15 LAB — BASIC METABOLIC PANEL
Anion gap: 10 (ref 5–15)
BUN: 7 mg/dL (ref 6–20)
CO2: 24 mmol/L (ref 22–32)
Calcium: 8.6 mg/dL — ABNORMAL LOW (ref 8.9–10.3)
Chloride: 100 mmol/L (ref 98–111)
Creatinine, Ser: 1.06 mg/dL — ABNORMAL HIGH (ref 0.44–1.00)
GFR, Estimated: >60 mL/min (ref >60)
Glucose, Bld: 163 mg/dL — ABNORMAL HIGH (ref 70–99)
Potassium: 4.4 mmol/L (ref 3.5–5.1)
Sodium: 134 mmol/L — ABNORMAL LOW (ref 135–145)

## 2023-01-15 LAB — CBC
HCT: 31.5 % — ABNORMAL LOW (ref 36.0–46.0)
Hemoglobin: 10.1 g/dL — ABNORMAL LOW (ref 12.0–15.0)
MCH: 25.2 pg — ABNORMAL LOW (ref 26.0–34.0)
MCHC: 32.1 g/dL (ref 30.0–36.0)
MCV: 78.6 fL — ABNORMAL LOW (ref 80.0–100.0)
Platelets: 224 10*3/uL (ref 150–400)
RBC: 4.01 MIL/uL (ref 3.87–5.11)
RDW: 14.8 % (ref 11.5–15.5)
WBC: 9.6 10*3/uL (ref 4.0–10.5)
nRBC: 0 % (ref 0.0–0.2)

## 2023-01-15 MED ORDER — IBUPROFEN 800 MG PO TABS: 800.0000 mg | ORAL_TABLET | Freq: Three times a day (TID) | ORAL | 1 refills | Status: DC

## 2023-01-15 MED ORDER — OXYCODONE-ACETAMINOPHEN 5-325 MG PO TABS: 1.0000 | ORAL_TABLET | ORAL | 0 refills | Status: DC | PRN

## 2023-01-15 MED ORDER — PROMETHAZINE HCL 25 MG PO TABS
25.0000 mg | ORAL_TABLET | Freq: Four times a day (QID) | ORAL | 1 refills | Status: AC | PRN
Start: 2023-01-15 — End: ?

## 2023-01-15 NOTE — Discharge Summary (Signed)
Physician Discharge Summary  Patient ID: Melissa Aguirre MRN: [id number] DOB/AGE: 1975-08-07 47 y.o.  Admit date: 01/14/2023 Discharge date: 01/15/2023  Admission Diagnoses: [facility]  Discharge Diagnoses:  Principal Problem:   Post-operative state   Discharged Condition: good  Hospital Course: Melissa Aguirre was admitted with above Dx. She underwent TVH with LS on 01/14/23 without problems. See OP note for additional information. Post op course was unremarkable. She progressed to tolerating diet, voiding, good oral pain control. Felt amendable for discharge home. Discharge instructions, medications and follow up reviewed with the pt. Pt verbalized understanding.   Consults: None  Significant Diagnostic Studies: labs  Treatments: surgery: TVH with LS  Discharge Exam: Blood pressure 122/86, pulse 69, temperature 98.3 F (36.8 C), temperature source Oral, resp. rate 17, height 5\' 8"  (1.727 m), weight 99.8 kg, last menstrual period 01/07/2023, SpO2 100 %. Lungs clear Heart RRR Abd soft + BS GU no bleeding Ext non tender   Disposition: Discharge disposition: 01-Home or Self Care       Discharge Instructions     Call MD for:  difficulty breathing, headache or visual disturbances   Complete by: As directed    Call MD for:  extreme fatigue   Complete by: As directed    Call MD for:  hives   Complete by: As directed    Call MD for:  persistant dizziness or light-headedness   Complete by: As directed    Call MD for:  persistant nausea and vomiting   Complete by: As directed    Call MD for:  redness, tenderness, or signs of infection (pain, swelling, redness, odor or green/yellow discharge around incision site)   Complete by: As directed    Call MD for:  severe uncontrolled pain   Complete by: As directed    Call MD for:  temperature >100.4   Complete by: As directed    Diet - low sodium heart healthy   Complete by: As directed    Increase activity slowly   Complete by: As  directed    Sexual Activity Restrictions   Complete by: As directed    Pelvic rest x 4 weeks      Allergies as of 01/15/2023       Reactions   Shellfish Allergy    Headache, nausea, see spots   Sulfa Antibiotics    Unknown childhood allergy        Medication List     STOP taking these medications    meloxicam 15 MG tablet Commonly known as: MOBIC       TAKE these medications    amLODipine 10 MG tablet Commonly known as: NORVASC Take 10 mg by mouth daily.   cyclobenzaprine 10 MG tablet Commonly known as: FLEXERIL Take 5-10 mg by mouth 2 (two) times daily as needed for muscle spasms.   famotidine 20 MG tablet Commonly known as: PEPCID Take 20 mg by mouth daily as needed for heartburn or indigestion.   ibuprofen 800 MG tablet Commonly known as: ADVIL Take 1 tablet (800 mg total) by mouth 3 (three) times daily.   losartan 50 MG tablet Commonly known as: COZAAR Take 50 mg by mouth daily.   omeprazole 20 MG capsule Commonly known as: PRILOSEC Take 20 mg by mouth daily.   oxyCODONE-acetaminophen 5-325 MG tablet Commonly known as: PERCOCET/ROXICET Take 1 tablet by mouth every 4 (four) hours as needed for moderate pain.   promethazine 25 MG tablet Commonly known as: PHENERGAN Take 1 tablet (25 mg  total) by mouth every 6 (six) hours as needed for nausea or vomiting.        Follow-up Information     [facility]. Schedule an appointment as soon as possible for a visit in 4 week(s).   Contact information: [address] [place] [address] [phone]                Signed: [name] 01/15/2023, 7:57 AM

## 2023-01-16 LAB — SURGICAL PATHOLOGY

## 2023-01-21 NOTE — Anesthesia Postprocedure Evaluation (Signed)
Anesthesia Post Note  Patient: Melissa Aguirre  Procedure(s) Performed: VAGINAL HYSTERECTOMY AND LEFT SALPINGECTOMY (Bilateral: Vagina )     Patient location during evaluation: PACU Anesthesia Type: General Level of consciousness: awake and alert Pain management: pain level controlled Vital Signs Assessment: post-procedure vital signs reviewed and stable Respiratory status: spontaneous breathing, nonlabored ventilation and respiratory function stable Cardiovascular status: blood pressure returned to baseline and stable Postop Assessment: no apparent nausea or vomiting Anesthetic complications: no   No notable events documented.  Last Vitals:  Vitals:   01/15/23 0335 01/15/23 0827  BP: 122/86 134/84  Pulse: 69 86  Resp: 17 18  Temp: 36.8 C 37.2 C  SpO2: 100% 100%    Last Pain:  Vitals:   01/15/23 0827  TempSrc: Oral  PainSc:                  Secily Walthour

## 2023-02-19 ENCOUNTER — Encounter: Payer: Self-pay | Admitting: Obstetrics and Gynecology

## 2023-02-19 ENCOUNTER — Ambulatory Visit (INDEPENDENT_AMBULATORY_CARE_PROVIDER_SITE_OTHER): Payer: Medicaid Other | Admitting: Obstetrics and Gynecology

## 2023-02-19 VITALS — BP 129/92 | HR 88 | Ht 69.0 in | Wt 221.9 lb

## 2023-02-19 DIAGNOSIS — Z90710 Acquired absence of both cervix and uterus: Secondary | ICD-10-CM

## 2023-02-19 DIAGNOSIS — Z9079 Acquired absence of other genital organ(s): Secondary | ICD-10-CM

## 2023-02-19 NOTE — Progress Notes (Signed)
Pt is in the office for follow up after vaginal hysterectomy on 01/14/23 Pt denies any complications today.

## 2023-02-19 NOTE — Progress Notes (Signed)
Ms Melissa Aguirre presents for post op appt S/P TVH with BS Pathology reviewed with pt Doing well Denies any bowel of bladder dysfunction  PE AF VSS Chaperone present Lungs clear Heart RRR Abd soft + BS GU nl EGBUS cuff well healed no masses or tenderness  A/P Post op TVH with BS  Retrun to nl ADL's  F/U PRN

## 2024-10-27 ENCOUNTER — Ambulatory Visit: Admitting: Physician Assistant
